# Patient Record
Sex: Female | Born: 1980 | Hispanic: Yes | Marital: Married | Smoking: Never smoker
Health system: Southern US, Community
[De-identification: ages and names within clinical notes are randomized; demographics above are authoritative.]

## PROBLEM LIST (undated history)

## (undated) DIAGNOSIS — R51 Headache: Secondary | ICD-10-CM

## (undated) DIAGNOSIS — D649 Anemia, unspecified: Secondary | ICD-10-CM

## (undated) DIAGNOSIS — Z789 Other specified health status: Secondary | ICD-10-CM

## (undated) DIAGNOSIS — R7303 Prediabetes: Secondary | ICD-10-CM

## (undated) DIAGNOSIS — K219 Gastro-esophageal reflux disease without esophagitis: Secondary | ICD-10-CM

## (undated) DIAGNOSIS — R519 Headache, unspecified: Secondary | ICD-10-CM

## (undated) HISTORY — PX: NO PAST SURGERIES: SHX2092

## (undated) HISTORY — DX: Prediabetes: R73.03

## (undated) HISTORY — DX: Other specified health status: Z78.9

---
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
## 2004-11-15 ENCOUNTER — Inpatient Hospital Stay (HOSPITAL_COMMUNITY): Admission: AD | Admit: 2004-11-15 | Discharge: 2004-11-15 | Payer: Self-pay | Admitting: Obstetrics

## 2004-11-21 ENCOUNTER — Ambulatory Visit (HOSPITAL_COMMUNITY): Admission: RE | Admit: 2004-11-21 | Discharge: 2004-11-21 | Payer: Self-pay | Admitting: Obstetrics & Gynecology

## 2004-12-04 ENCOUNTER — Inpatient Hospital Stay (HOSPITAL_COMMUNITY): Admission: AD | Admit: 2004-12-04 | Discharge: 2004-12-06 | Payer: Self-pay | Admitting: *Deleted

## 2004-12-04 ENCOUNTER — Encounter (INDEPENDENT_AMBULATORY_CARE_PROVIDER_SITE_OTHER): Payer: Self-pay | Admitting: *Deleted

## 2005-04-25 ENCOUNTER — Emergency Department (HOSPITAL_COMMUNITY): Admission: EM | Admit: 2005-04-25 | Discharge: 2005-04-25 | Payer: Self-pay | Admitting: Emergency Medicine

## 2005-04-27 ENCOUNTER — Emergency Department (HOSPITAL_COMMUNITY): Admission: EM | Admit: 2005-04-27 | Discharge: 2005-04-27 | Payer: Self-pay | Admitting: Emergency Medicine

## 2005-05-03 ENCOUNTER — Emergency Department (HOSPITAL_COMMUNITY): Admission: EM | Admit: 2005-05-03 | Discharge: 2005-05-03 | Payer: Self-pay | Admitting: Family Medicine

## 2005-06-22 ENCOUNTER — Emergency Department (HOSPITAL_COMMUNITY): Admission: EM | Admit: 2005-06-22 | Discharge: 2005-06-22 | Payer: Self-pay | Admitting: Family Medicine

## 2005-06-28 ENCOUNTER — Emergency Department (HOSPITAL_COMMUNITY): Admission: EM | Admit: 2005-06-28 | Discharge: 2005-06-28 | Payer: Self-pay | Admitting: Family Medicine

## 2007-05-23 ENCOUNTER — Emergency Department (HOSPITAL_COMMUNITY): Admission: EM | Admit: 2007-05-23 | Discharge: 2007-05-23 | Payer: Self-pay | Admitting: Emergency Medicine

## 2009-10-05 ENCOUNTER — Emergency Department (HOSPITAL_COMMUNITY): Admission: EM | Admit: 2009-10-05 | Discharge: 2009-10-05 | Payer: Self-pay | Admitting: Emergency Medicine

## 2010-06-17 LAB — POCT RAPID STREP A (OFFICE): Streptococcus, Group A Screen (Direct): NEGATIVE

## 2013-10-05 ENCOUNTER — Other Ambulatory Visit: Payer: Self-pay | Admitting: Nurse Practitioner

## 2013-10-05 DIAGNOSIS — N63 Unspecified lump in unspecified breast: Secondary | ICD-10-CM

## 2013-10-12 ENCOUNTER — Ambulatory Visit
Admission: RE | Admit: 2013-10-12 | Discharge: 2013-10-12 | Disposition: A | Discharge status: Home / Self Care | Payer: No Typology Code available for payment source | Source: Ambulatory Visit | Attending: Nurse Practitioner | Admitting: Nurse Practitioner

## 2013-10-12 DIAGNOSIS — N63 Unspecified lump in unspecified breast: Secondary | ICD-10-CM

## 2015-02-07 ENCOUNTER — Other Ambulatory Visit (HOSPITAL_COMMUNITY): Payer: Self-pay | Admitting: *Deleted

## 2015-02-07 DIAGNOSIS — N6320 Unspecified lump in the left breast, unspecified quadrant: Secondary | ICD-10-CM

## 2015-02-08 ENCOUNTER — Encounter (HOSPITAL_COMMUNITY): Payer: Self-pay | Admitting: *Deleted

## 2015-02-10 ENCOUNTER — Ambulatory Visit (HOSPITAL_COMMUNITY)
Admission: RE | Admit: 2015-02-10 | Discharge: 2015-02-10 | Disposition: A | Discharge status: Home / Self Care | Payer: Self-pay | Source: Ambulatory Visit | Attending: Obstetrics and Gynecology | Admitting: Obstetrics and Gynecology

## 2015-02-10 ENCOUNTER — Encounter: Payer: Self-pay | Admitting: *Deleted

## 2015-02-10 ENCOUNTER — Ambulatory Visit: Payer: Self-pay

## 2015-02-10 ENCOUNTER — Encounter (HOSPITAL_COMMUNITY): Payer: Self-pay

## 2015-02-10 ENCOUNTER — Ambulatory Visit
Admission: RE | Admit: 2015-02-10 | Discharge: 2015-02-10 | Disposition: A | Discharge status: Home / Self Care | Payer: No Typology Code available for payment source | Source: Ambulatory Visit | Attending: Obstetrics and Gynecology | Admitting: Obstetrics and Gynecology

## 2015-02-10 ENCOUNTER — Ambulatory Visit (INDEPENDENT_AMBULATORY_CARE_PROVIDER_SITE_OTHER): Payer: Medicaid Other | Admitting: *Deleted

## 2015-02-10 ENCOUNTER — Encounter: Payer: Self-pay | Admitting: Obstetrics & Gynecology

## 2015-02-10 VITALS — BP 118/80 | Temp 98.2°F | Ht 62.0 in | Wt 128.0 lb

## 2015-02-10 DIAGNOSIS — Z1239 Encounter for other screening for malignant neoplasm of breast: Secondary | ICD-10-CM

## 2015-02-10 DIAGNOSIS — N6320 Unspecified lump in the left breast, unspecified quadrant: Secondary | ICD-10-CM

## 2015-02-10 DIAGNOSIS — N6321 Unspecified lump in the left breast, upper outer quadrant: Secondary | ICD-10-CM

## 2015-02-10 DIAGNOSIS — N926 Irregular menstruation, unspecified: Secondary | ICD-10-CM

## 2015-02-10 DIAGNOSIS — Z3201 Encounter for pregnancy test, result positive: Secondary | ICD-10-CM

## 2015-02-10 LAB — POCT PREGNANCY, URINE: PREG TEST UR: POSITIVE — AB

## 2015-02-10 NOTE — Progress Notes (Signed)
Complaints of left breat lump x more than a year that has increased in size per patient. Patient had a diagnostic mammogram and left breast ultrasound [facility] that 6 month follow-up was recommended and patient did not follow up.  Pap Smear: Pap smear not completed today. Last Pap [facility] and normal. Per patient has no history of an abnormal Pap smear. Pap smear result is in EPIC.  Physical exam: Breasts Breasts symmetrical. No skin abnormalities bilateral breasts. No nipple retraction bilateral breasts. No nipple discharge bilateral breasts. No lymphadenopathy. No lumps palpated right breast. Palpated a lump within the left breast at 2 o'clock 4 cm from the nipple. No complaints of pain or tenderness on exam. [facility]. Appointment scheduled for Friday, February 10, 2015 at 1400.   Pelvic/Bimanual No Pap smear completed today since last Pap smear was 01/06/2015. Pap smear not indicated per [facility] guidelines.   Used interpreter Nira ConnJulia Sowell.

## 2015-02-10 NOTE — Progress Notes (Signed)
Pt sent from [facility] to have urine pregnancy test. Result - positive.  Pregnancy verification letter given.

## 2015-02-10 NOTE — Patient Instructions (Addendum)
Educational materials on self breast awareness given. Explained to [name] that she did not need a Pap smear today due to last Pap smear was 01/06/2015. Let her know [facility] will cover Pap smears every 3 years unless has a history of abnormal Pap smears. [facility] breast [facility]. Appointment scheduled for Friday, February 10, 2015 at 1400. Patient aware of appointment and will be there. [name] verbalized understanding.  [name], Annette Maserhristine Poll, RN 11:53 AM

## 2015-03-29 ENCOUNTER — Ambulatory Visit (INDEPENDENT_AMBULATORY_CARE_PROVIDER_SITE_OTHER): Payer: Medicaid Other | Admitting: Certified Nurse Midwife

## 2015-03-29 ENCOUNTER — Encounter: Payer: Self-pay | Admitting: Certified Nurse Midwife

## 2015-03-29 VITALS — BP 123/66 | HR 90 | Temp 98.2°F | Wt 133.0 lb

## 2015-03-29 DIAGNOSIS — Z3481 Encounter for supervision of other normal pregnancy, first trimester: Secondary | ICD-10-CM | POA: Diagnosis not present

## 2015-03-29 DIAGNOSIS — O219 Vomiting of pregnancy, unspecified: Secondary | ICD-10-CM | POA: Diagnosis not present

## 2015-03-29 DIAGNOSIS — Z3491 Encounter for supervision of normal pregnancy, unspecified, first trimester: Secondary | ICD-10-CM | POA: Insufficient documentation

## 2015-03-29 LAB — POCT URINALYSIS DIPSTICK
Bilirubin, UA: NEGATIVE
Blood, UA: NEGATIVE
Glucose, UA: NEGATIVE
Ketones, UA: NEGATIVE
LEUKOCYTES UA: NEGATIVE
Nitrite, UA: NEGATIVE
PROTEIN UA: NEGATIVE
Spec Grav, UA: 1.015
UROBILINOGEN UA: NEGATIVE
pH, UA: 6

## 2015-03-29 LAB — TSH: TSH: 0.552 u[IU]/mL (ref 0.350–4.500)

## 2015-03-29 MED ORDER — DOXYLAMINE-PYRIDOXINE 10-10 MG PO TBEC: DELAYED_RELEASE_TABLET | ORAL | Status: DC

## 2015-03-29 MED ORDER — METOCLOPRAMIDE HCL 10 MG PO TABS: 10.0000 mg | ORAL_TABLET | Freq: Three times a day (TID) | ORAL | Status: DC

## 2015-03-29 MED ORDER — PROMETHAZINE HCL 25 MG PO TABS: 25.0000 mg | ORAL_TABLET | Freq: Four times a day (QID) | ORAL | Status: DC | PRN

## 2015-03-29 NOTE — Progress Notes (Signed)
Subjective:    Annette Whitehead is being seen today for her first obstetrical visit.  This is a planned pregnancy. She is at [redacted]w[redacted]d gestation. Her obstetrical history is significant for none. Relationship with FOB: spouse, living together. Patient does intend to breast feed. Pregnancy history fully reviewed.  Had IUD removed and got pregnant.    The information documented in the HPI was reviewed and verified.  Menstrual History: OB History    Gravida Para Term Preterm AB TAB SAB Ectopic Multiple Living   0 0 0 0 0 0 2      Menarche age: 29-58 years of age.  Patient's last menstrual period was 01/04/2015 (exact date).    Past Medical History  Diagnosis Date  . Medical history non-contributory     Past Surgical History  Procedure Laterality Date  . No past surgeries       (Not in a hospital admission) No Known Allergies  Social History  Substance Use Topics  . Smoking status: Never Smoker   . Smokeless tobacco: Never Used  . Alcohol Use: No    Family History  Problem Relation Age of Onset  . Diabetes Mother   . Hypertension Mother   . Hyperlipidemia Mother   . Osteoporosis Mother   . Diabetes Father      Review of Systems Constitutional: negative for weight loss Gastrointestinal: + for nausea & vomiting Genitourinary:negative for genital lesions and vaginal discharge and dysuria Musculoskeletal:negative for back pain Behavioral/Psych: negative for abusive relationship, depression, illegal drug usage and tobacco use    Objective:    BP 123/66 mmHg  Pulse 90  Temp(Src) 98.2 F (36.8 C)  Wt 133 lb (60.328 kg)  LMP 01/04/2015 (Exact Date) General Appearance:    Alert, cooperative, no distress, appears stated age  Head:    Normocephalic, without obvious abnormality, atraumatic  Eyes:    PERRL, conjunctiva/corneas clear, EOM's intact, fundi    benign, both eyes  Ears:    Normal TM's and external ear canals, both ears  Nose:   Nares normal, septum midline,  mucosa normal, no drainage    or sinus tenderness  Throat:   Lips, mucosa, and tongue normal; teeth and gums normal  Neck:   Supple, symmetrical, trachea midline, no adenopathy;    thyroid:  no enlargement/tenderness/nodules; no carotid   bruit or JVD  Back:     Symmetric, no curvature, ROM normal, no CVA tenderness  Lungs:     Clear to auscultation bilaterally, respirations unlabored  Chest Wall:    No tenderness or deformity   Heart:    Regular rate and rhythm, S1 and S2 normal, no murmur, rub   or gallop  Breast Exam:    No tenderness, masses, or nipple abnormality  Abdomen:     Soft, non-tender, bowel sounds active all four quadrants,    no masses, no organomegaly  Genitalia:    Normal female without lesion, discharge or tenderness  Extremities:   Extremities normal, atraumatic, no cyanosis or edema  Pulses:   2+ and symmetric all extremities  Skin:   Skin color, texture, turgor normal, no rashes or lesions  Lymph nodes:   Cervical, supraclavicular, and axillary nodes normal  Neurologic:   CNII-XII intact, normal strength, sensation and reflexes    throughout     Cervix:  Long, thick, closed and posterior  FHR:  160's by doppler.  Fundal height below U.   Lab Review Urine pregnancy test Labs reviewed yes Radiologic studies reviewed no Assessment:    Pregnancy at 4335w0d weeks   N&V in early pregnancy  Plan:      Prenatal vitamins.  Counseling provided regarding continued use of seat belts, cessation of alcohol consumption, smoking or use of illicit drugs; infection precautions i.e., influenza/TDAP immunizations, toxoplasmosis,CMV, parvovirus, listeria and varicella; workplace safety, exercise during pregnancy; routine dental care, safe medications, sexual activity, hot tubs, saunas, pools, travel, caffeine use, fish and methlymercury, potential toxins, hair treatments, varicose veins Weight gain recommendations per IOM guidelines reviewed:  underweight/BMI< 18.5--> gain 28 - 40 lbs; normal weight/BMI 18.5 - 24.9--> gain 25 - 35 lbs; overweight/BMI 25 - 29.9--> gain 15 - 25 lbs; obese/BMI >30->gain  11 - 20 lbs Problem list reviewed and updated. FIRST/CF mutation testing/NIPT/QUAD SCREEN/fragile X/Ashkenazi Jewish population testing/Spinal muscular atrophy discussed: requested. Role of ultrasound in pregnancy discussed; fetal survey: requested. Amniocentesis discussed: not indicated. VBAC calculator score: VBAC consent form provided Meds ordered this encounter  Medications  . Prenatal Vit-Fe Fumarate-FA (MULTIVITAMIN-PRENATAL) 27-0.8 MG TABS tablet    Sig: Take 1 tablet by mouth daily at 12 noon.   Orders Placed This Encounter  Procedures  . Culture, OB Urine  . SureSwab, Vaginosis/Vaginitis Plus  . Obstetric panel  . HIV antibody  . Hemoglobinopathy evaluation  . Varicella zoster antibody, IgG  . VITAMIN D 25 Hydroxy (Vit-D Deficiency, Fractures)  . TSH  . POCT urinalysis dipstick    Follow up in 4 weeks. 50% of 30 min visit spent on counseling and coordination of care.

## 2015-03-30 LAB — OBSTETRIC PANEL
Antibody Screen: NEGATIVE
Basophils Absolute: 0 10*3/uL (ref 0.0–0.1)
Basophils Relative: 0 % (ref 0–1)
Eosinophils Absolute: 0 10*3/uL (ref 0.0–0.7)
Eosinophils Relative: 0 % (ref 0–5)
HCT: 40.3 % (ref 36.0–46.0)
HEP B S AG: NEGATIVE
Hemoglobin: 13.7 g/dL (ref 12.0–15.0)
Lymphocytes Relative: 14 % (ref 12–46)
Lymphs Abs: 1 10*3/uL (ref 0.7–4.0)
MCH: 28.8 pg (ref 26.0–34.0)
MCHC: 34 g/dL (ref 30.0–36.0)
MCV: 84.7 fL (ref 78.0–100.0)
MPV: 9.9 fL (ref 8.6–12.4)
Monocytes Absolute: 0.3 10*3/uL (ref 0.1–1.0)
Monocytes Relative: 5 % (ref 3–12)
NEUTROS ABS: 5.6 10*3/uL (ref 1.7–7.7)
Neutrophils Relative %: 81 % — ABNORMAL HIGH (ref 43–77)
Platelets: 183 10*3/uL (ref 150–400)
RBC: 4.76 MIL/uL (ref 3.87–5.11)
RDW: 14.1 % (ref 11.5–15.5)
RH TYPE: POSITIVE
Rubella: 1.51 Index — ABNORMAL HIGH (ref <0.90)
WBC: 6.9 10*3/uL (ref 4.0–10.5)

## 2015-03-30 LAB — VARICELLA ZOSTER ANTIBODY, IGG: Varicella IgG: 1287 Index — ABNORMAL HIGH (ref <135.00)

## 2015-03-30 LAB — VITAMIN D 25 HYDROXY (VIT D DEFICIENCY, FRACTURES): VIT D 25 HYDROXY: 22 ng/mL — AB (ref 30–100)

## 2015-03-30 LAB — HIV ANTIBODY (ROUTINE TESTING W REFLEX): HIV 1&2 Ab, 4th Generation: NONREACTIVE

## 2015-03-31 LAB — CULTURE, OB URINE
COLONY COUNT: NO GROWTH
Organism ID, Bacteria: NO GROWTH

## 2015-03-31 LAB — HEMOGLOBINOPATHY EVALUATION
HEMOGLOBIN OTHER: 0 %
HGB A2 QUANT: 1.7 % — AB (ref 2.2–3.2)
HGB A: 98.3 % — AB (ref 96.8–97.8)
HGB F QUANT: 0 % (ref 0.0–2.0)
HGB S QUANTITAION: 0 %

## 2015-04-01 LAB — SURESWAB, VAGINOSIS/VAGINITIS PLUS
ATOPOBIUM VAGINAE: NOT DETECTED Log (cells/mL)
BV CATEGORY: UNDETERMINED — AB
C. PARAPSILOSIS, DNA: NOT DETECTED
C. TRACHOMATIS RNA, TMA: NOT DETECTED
C. TROPICALIS, DNA: NOT DETECTED
C. albicans, DNA: NOT DETECTED
C. glabrata, DNA: NOT DETECTED
GARDNERELLA VAGINALIS: 6.6 Log (cells/mL)
LACTOBACILLUS SPECIES: DETECTED Log (cells/mL)
MEGASPHAERA SPECIES: NOT DETECTED Log (cells/mL)
N. gonorrhoeae RNA, TMA: NOT DETECTED
T. vaginalis RNA, QL TMA: NOT DETECTED

## 2015-04-02 NOTE — L&D Delivery Note (Signed)
Delivery Note At 7:56 AM a viable female was delivered via Vaginal, Spontaneous Delivery (Presentation: R ; OA ).  APGAR: 8,9 ; weight  pending.   Placenta status: Intact, Spontaneous.  Cord:  with the following complications: .  Cord pH: n/a  Anesthesia: Epidural  Episiotomy:  none Lacerations:  none Suture Repair: none Est. Blood Loss (mL): 100cc  Mom to postpartum.  Baby to Couplet care / Skin to Skin.  Annette Whitehead 10/02/2015, 8:09 AM

## 2015-04-05 ENCOUNTER — Other Ambulatory Visit: Payer: Self-pay | Admitting: Certified Nurse Midwife

## 2015-04-05 DIAGNOSIS — B9689 Other specified bacterial agents as the cause of diseases classified elsewhere: Secondary | ICD-10-CM

## 2015-04-05 DIAGNOSIS — N760 Acute vaginitis: Principal | ICD-10-CM

## 2015-04-05 MED ORDER — METRONIDAZOLE 0.75 % VA GEL: 1.0000 | Freq: Two times a day (BID) | VAGINAL | Status: DC

## 2015-04-11 ENCOUNTER — Ambulatory Visit (INDEPENDENT_AMBULATORY_CARE_PROVIDER_SITE_OTHER): Payer: Medicaid Other | Admitting: Certified Nurse Midwife

## 2015-04-11 VITALS — BP 117/64 | HR 77 | Temp 98.1°F | Wt 131.0 lb

## 2015-04-11 DIAGNOSIS — O219 Vomiting of pregnancy, unspecified: Secondary | ICD-10-CM

## 2015-04-11 DIAGNOSIS — Z3482 Encounter for supervision of other normal pregnancy, second trimester: Secondary | ICD-10-CM

## 2015-04-11 DIAGNOSIS — O4692 Antepartum hemorrhage, unspecified, second trimester: Secondary | ICD-10-CM

## 2015-04-11 LAB — POCT URINALYSIS DIPSTICK
BILIRUBIN UA: NEGATIVE
Blood, UA: NEGATIVE
GLUCOSE UA: NEGATIVE
Ketones, UA: NEGATIVE
LEUKOCYTES UA: NEGATIVE
NITRITE UA: NEGATIVE
Protein, UA: NEGATIVE
Spec Grav, UA: 1.015
Urobilinogen, UA: NEGATIVE
pH, UA: 6

## 2015-04-11 MED ORDER — VITAFOL GUMMIES 3.33-0.333-34.8 MG PO CHEW: 3.0000 | CHEWABLE_TABLET | Freq: Every day | ORAL | Status: DC

## 2015-04-11 MED ORDER — ONDANSETRON HCL 4 MG PO TABS: 4.0000 mg | ORAL_TABLET | Freq: Every day | ORAL | Status: DC | PRN

## 2015-04-11 NOTE — Progress Notes (Signed)
  Subjective:    Annette Whitehead is a 35 y.o. female being seen today for her obstetrical visit. She is at 5030w6d gestation. Patient reports: backache, bleeding, no contractions, no cramping and no leaking.  States that it was when she was wiping after using the bathroom on Saturday.  States that it is brown afterward, nothing today or since Sunday.  Denies any recent sexual intercourse.    Problem List Items Addressed This Visit    None    Visit Diagnoses    Supervision of other normal pregnancy, antepartum, second trimester    -  Primary    Relevant Medications    Prenatal Vit-Fe Phos-FA-Omega (VITAFOL GUMMIES) 3.33-0.333-34.8 MG CHEW    Other Relevant Orders    POCT urinalysis dipstick (Completed)    Vaginal bleeding in pregnancy, second trimester        Relevant Orders    US OB Comp Less 14 Wks    Nausea and vomiting in pregnancy prior to [redacted] weeks gestation        Relevant Medications    ondansetron (ZOFRAN) 4 MG tablet      Patient Active Problem List   Diagnosis Date Noted  . Supervision of normal pregnancy in first trimester 03/29/2015    Objective:     BP 117/64 mmHg  Pulse 77  Temp(Src) 98.1 F (36.7 C)  Wt 131 lb (59.421 kg)  LMP 01/04/2015 (Exact Date) Uterine Size: Below umbilicus   FHR: 160 by doppler.  FHR and fetal activity seen with bedside US.   Assessment:    Pregnancy @ 4030w6d  weeks Vaginal bleeding in early pregnancy.      Plan:   Reassurance given.   US ordered  Problem list reviewed and updated. Labs reviewed.  Follow up in 4 weeks. FIRST/CF mutation testing/NIPT/QUAD SCREEN/fragile X/Ashkenazi Jewish population testing/Spinal muscular atrophy discussed: requested. Role of ultrasound in pregnancy discussed; fetal survey: requested. Amniocentesis discussed: not indicated. 50% of 25 minute visit spent on counseling and coordination of care.

## 2015-04-11 NOTE — Progress Notes (Signed)
Patient states she had some bleeding over the weekend. Patient denies heavy bleeding or passing of any tissue. Patient states it was only when she was wiping. Patient states she is having pressure in her lower abdomen. Patient states the bleeding has stopped

## 2015-04-20 ENCOUNTER — Other Ambulatory Visit: Payer: Self-pay | Admitting: Certified Nurse Midwife

## 2015-04-20 ENCOUNTER — Ambulatory Visit: Payer: Medicaid Other | Admitting: Certified Nurse Midwife

## 2015-04-20 ENCOUNTER — Ambulatory Visit (INDEPENDENT_AMBULATORY_CARE_PROVIDER_SITE_OTHER): Payer: Medicaid Other

## 2015-04-20 ENCOUNTER — Ambulatory Visit (INDEPENDENT_AMBULATORY_CARE_PROVIDER_SITE_OTHER): Payer: Medicaid Other | Admitting: Certified Nurse Midwife

## 2015-04-20 VITALS — BP 111/65 | HR 90 | Temp 98.8°F | Wt 135.0 lb

## 2015-04-20 DIAGNOSIS — O4692 Antepartum hemorrhage, unspecified, second trimester: Secondary | ICD-10-CM

## 2015-04-20 DIAGNOSIS — Z1389 Encounter for screening for other disorder: Secondary | ICD-10-CM

## 2015-04-20 DIAGNOSIS — Z3482 Encounter for supervision of other normal pregnancy, second trimester: Secondary | ICD-10-CM

## 2015-04-20 DIAGNOSIS — Z331 Pregnant state, incidental: Secondary | ICD-10-CM

## 2015-04-20 NOTE — Progress Notes (Signed)
  Subjective:    Annette Whitehead is a 35 y.o. female being seen today for her obstetrical visit. She is at [redacted]w[redacted]d gestation. Patient reports: no complaints.  Problem List Items Addressed This Visit    None    Visit Diagnoses    Supervision of other normal pregnancy, antepartum, second trimester    -  Primary    Relevant Orders    POCT urinalysis dipstick    AFP, Quad Screen    [place] MFM OB COMP + 14 WK      Patient Active Problem List   Diagnosis Date Noted  . Supervision of normal pregnancy in first trimester 03/29/2015    Objective:     BP 111/65 mmHg  Pulse 90  Temp(Src) 98.8 F (37.1 C)  Wt 135 lb (61.236 kg)  LMP 01/04/2015 (Exact Date) Uterine Size: Below umbilicus   FHR: 150's  Assessment:    Pregnancy @ [redacted]w[redacted]d  weeks Doing well    Plan:    Problem list reviewed and updated. Labs reviewed.  Follow up in 4 weeks. FIRST/CF mutation testing/NIPT/QUAD SCREEN/fragile X/Ashkenazi Jewish population testing/Spinal muscular atrophy discussed: ordered. Role of ultrasound in pregnancy discussed; fetal survey: ordered. Amniocentesis discussed: not indicated. 50% of 15 minute visit spent on counseling and coordination of care.

## 2015-04-21 LAB — AFP, QUAD SCREEN
AFP: 66.3 ng/mL
Age Alone: 1:375 {titer}
Curr Gest Age: 15.1 wks.days
Down Syndrome Scr Risk Est: 1:121 {titer}
HCG TOTAL: 91.07 [IU]/mL
INH: 340.1 pg/mL
Interpretation-AFP: POSITIVE — AB
MOM FOR HCG: 2.11
MOM FOR INH: 2.17
MoM for AFP: 2.3
OPEN SPINA BIFIDA: NEGATIVE
Osb Risk: 1:376 {titer}
Tri 18 Scr Risk Est: NEGATIVE
Trisomy 18 (Edward) Syndrome Interp.: 1:2710 {titer}
uE3 Mom: 0.46
uE3 Value: 0.4 ng/mL

## 2015-04-22 ENCOUNTER — Other Ambulatory Visit: Payer: Self-pay | Admitting: Certified Nurse Midwife

## 2015-04-22 DIAGNOSIS — O4402 Placenta previa specified as without hemorrhage, second trimester: Secondary | ICD-10-CM

## 2015-04-25 ENCOUNTER — Other Ambulatory Visit: Payer: Self-pay | Admitting: Certified Nurse Midwife

## 2015-04-25 DIAGNOSIS — R772 Abnormality of alphafetoprotein: Secondary | ICD-10-CM

## 2015-04-26 ENCOUNTER — Ambulatory Visit (INDEPENDENT_AMBULATORY_CARE_PROVIDER_SITE_OTHER): Payer: Medicaid Other | Admitting: Certified Nurse Midwife

## 2015-04-26 VITALS — BP 115/74 | HR 84 | Temp 98.2°F | Wt 133.0 lb

## 2015-04-26 DIAGNOSIS — Z3492 Encounter for supervision of normal pregnancy, unspecified, second trimester: Secondary | ICD-10-CM

## 2015-04-26 LAB — POCT URINALYSIS DIPSTICK
Bilirubin, UA: NEGATIVE
Blood, UA: NEGATIVE
Glucose, UA: NEGATIVE
Ketones, UA: NEGATIVE
Nitrite, UA: NEGATIVE
PROTEIN UA: NEGATIVE
Spec Grav, UA: <=1.005
UROBILINOGEN UA: NEGATIVE
pH, UA: 7

## 2015-04-26 NOTE — Progress Notes (Signed)
Patient is having some pressure- some more yesterday than usual

## 2015-04-26 NOTE — Progress Notes (Signed)
  Subjective:    Annette Whitehead is a 35 y.o. female being seen today for her obstetrical visit. She is at [redacted]w[redacted]d gestation. Patient reports: no bleeding, no contractions, no cramping and no leaking.  Discussed elevated AFP.  Patient is scheduled with MFM in mid February.    Problem List Items Addressed This Visit    None    Visit Diagnoses    Prenatal care, second trimester    -  Primary    Relevant Orders    POCT urinalysis dipstick (Completed)      Patient Active Problem List   Diagnosis Date Noted  . Placenta previa antepartum in second trimester 04/22/2015  . Supervision of normal pregnancy in first trimester 03/29/2015    Objective:     BP 115/74 mmHg  Pulse 84  Temp(Src) 98.2 F (36.8 C)  Wt 133 lb (60.328 kg)  LMP 01/04/2015 (Exact Date) Uterine Size: Below umbilicus   FHR about 160's.    Assessment:    Pregnancy @ [redacted]w[redacted]d  weeks  Placenta previa, ? Cause of elevated AFP     Elevated AFP  Plan:    Problem list reviewed and updated. Labs reviewed.  Follow up in 4 weeks. FIRST/CF mutation testing/NIPT/QUAD SCREEN/fragile X/Ashkenazi Jewish population testing/Spinal muscular atrophy discussed: results reviewed. Role of ultrasound in pregnancy discussed; fetal survey: ordered. Amniocentesis discussed: deferred to MFM. 50% of 15 minute visit spent on counseling and coordination of care.

## 2015-05-15 ENCOUNTER — Encounter (HOSPITAL_COMMUNITY): Payer: Self-pay

## 2015-05-15 ENCOUNTER — Other Ambulatory Visit: Payer: Self-pay | Admitting: Certified Nurse Midwife

## 2015-05-15 ENCOUNTER — Ambulatory Visit (HOSPITAL_COMMUNITY)
Admission: RE | Admit: 2015-05-15 | Discharge: 2015-05-15 | Disposition: A | Discharge status: Home / Self Care | Payer: Medicaid Other | Source: Ambulatory Visit | Attending: Certified Nurse Midwife | Admitting: Certified Nurse Midwife

## 2015-05-15 VITALS — BP 116/65 | HR 70 | Wt 137.2 lb

## 2015-05-15 DIAGNOSIS — O280 Abnormal hematological finding on antenatal screening of mother: Secondary | ICD-10-CM

## 2015-05-15 DIAGNOSIS — Z1389 Encounter for screening for other disorder: Secondary | ICD-10-CM

## 2015-05-15 DIAGNOSIS — O4442 Low lying placenta NOS or without hemorrhage, second trimester: Secondary | ICD-10-CM | POA: Insufficient documentation

## 2015-05-15 DIAGNOSIS — Z3A18 18 weeks gestation of pregnancy: Secondary | ICD-10-CM

## 2015-05-15 DIAGNOSIS — Z36 Encounter for antenatal screening of mother: Secondary | ICD-10-CM | POA: Diagnosis not present

## 2015-05-15 DIAGNOSIS — Z3482 Encounter for supervision of other normal pregnancy, second trimester: Secondary | ICD-10-CM

## 2015-05-15 DIAGNOSIS — O289 Unspecified abnormal findings on antenatal screening of mother: Secondary | ICD-10-CM

## 2015-05-15 NOTE — Progress Notes (Signed)
Genetic Counseling  High-Risk Gestation Note  Appointment Date:  05/15/2015 Referred By: Roe Coombs, CNM Date of Birth:  1981-01-27 Partner:  Rozell Searing   Pregnancy History: [id number] Estimated Date of Delivery: 10/11/15 Estimated Gestational Age: [redacted]w[redacted]d Attending: Particia Nearing, MD    Ms. Libbi Largo and her husband, Mr. Rozell Searing, were seen for genetic counseling because of an increased risk for fetal Down syndrome based on Quad screen through [facility] laboratory.  In Summary:   1 in 121 Down syndrome risk from Quad screen  Detailed ultrasound performed today; Fetal anatomy within normal limits  Patient declined NIPS and amniocentesis  Follow-up ultrasound scheduled in 8 weeks given elevated DIA on Quad screen and given low lying placenta  Family history significant for sister to patient with birth defect; see discussion below.   They were counseled regarding the Quad screen result and the associated 1 in 121 risk for fetal Down syndrome.  We reviewed chromosomes, nondisjunction, and the common features and variable prognosis of Down syndrome.  In addition, we reviewed the screen negative risks for trisomy 18 and ONTDs.  We also discussed other explanations for a screen positive result including: a gestational dating error, differences in maternal metabolism, and normal variation. They understand that this screening is not diagnostic for Down syndrome but provides a risk assessment.  We specifically discussed that the level of one of the proteins analyzed on the screen, DIA, was very high (2.17 MoM).  This has been associated with an increased risk for growth restriction or poor pregnancy outcome later in pregnancy; therefore, we would recommend a follow up ultrasound for fetal growth in the third trimester.  We reviewed available screening options including noninvasive prenatal screening (NIPS)/prenatal cell free DNA (cfDNA) testing, and detailed ultrasound.   They were counseled that screening tests are used to modify a patient's a priori risk for aneuploidy, typically based on age. This estimate provides a pregnancy specific risk assessment. We reviewed the benefits and limitations of each option. Specifically, we discussed the conditions for which each test screens, the detection rates, and false positive rates of each. They were also counseled regarding diagnostic testing via amniocentesis. We reviewed the approximate 1 in 300-500 risk for complications for amniocentesis, including spontaneous pregnancy loss.   A complete ultrasound was performed today. The ultrasound report will be sent under separate cover. There were no visualized fetal anomalies or markers suggestive of aneuploidy. We reviewed the benefits and limitations of ultrasound as a screening tool for fetal aneuploidy.   After consideration of all the options, they declined NIPS and amniocentesis, stating they were comfortable with risk assessment from Quad screen and ultrasound.  They understand that screening tests cannot rule out all birth defects or genetic syndromes. The patient was advised of this limitation and states she still does not want additional testing at this time.   Ms. Vollie Aaron was provided with written information regarding cystic fibrosis (CF) including the carrier frequency and incidence in the Caucasian population, the availability of carrier testing and prenatal diagnosis if indicated.  In addition, we discussed that CF is routinely screened for as part of the [place] newborn screening panel.  She declined CF testing today.   Both family histories were reviewed and found to be contributory for a sister to the patient who died shortly after birth and had a birth defect. The patient reported that this sibling, the third about of seven children for her parents together, in addition to her mother's two previous  additional children, was described to have been missing her mouth.  The patient had very limited information regarding the medical features and reported that her sister was born in [place]. We discussed that this description could fit with an orofacial cleft. We briefly discussed that clefting is often isolated and multifactorial in origin but can be part of an underlying genetic syndrome or due to teratogenic exposures in pregnancy. Given the reported family history and additional unaffected siblings, we discussed that recurrence risk for the current pregnancy is likely relatively low. However, without further information regarding the provided family history, an accurate genetic risk cannot be calculated. Further genetic counseling is warranted if more information is obtained.  The patient also reported that her maternal grandmother had several sons who died in infancy given that they "stopped eating." They were not described to have birth defects, and she had very limited information about these relatives. The patient reported that her grandmother likely had limited access to medical care when her children were born. We discussed that it is difficult to discern the possible underlying cause for this without additional information. However, given the degree of relation and the additional reported family history, recurrence risk would likely be low for the pregnancy. Additional information may alter recurrence risk assessment.   Mrs. [facility] denied exposure to environmental toxins or chemical agents. She reported bleeding in the pregnancy at approximately [redacted] weeks gestation. She denied the use of tobacco or street drugs. She reported sipping her husband's beer on occasion if they are at a restaurant.  Prenatal alcohol exposure can increase the risk for growth delays, small head size, heart defects, eye and facial differences, as well as behavior problems and learning disabilities. The risk of these to occur tends to increase with the amount of alcohol consumed.  However, because there is no identified safe amount of alcohol in pregnancy, it is recommended to completely avoid alcohol in pregnancy. Given the reported amount of exposure, risk for associated effects are likely low in the current pregnancy. She denied significant viral illnesses during the course of her pregnancy. Her medical and surgical histories were noncontributory.   I counseled this couple for approximately 45 minutes regarding the above risks and available options.   Quinn Plowman, MS,  Certified Genetic Counselor 05/15/2015

## 2015-05-18 ENCOUNTER — Ambulatory Visit (INDEPENDENT_AMBULATORY_CARE_PROVIDER_SITE_OTHER): Payer: Medicaid Other | Admitting: Certified Nurse Midwife

## 2015-05-18 ENCOUNTER — Ambulatory Visit (HOSPITAL_COMMUNITY): Payer: No Typology Code available for payment source

## 2015-05-18 VITALS — BP 113/72 | HR 92 | Temp 98.5°F | Wt 136.0 lb

## 2015-05-18 DIAGNOSIS — Z3482 Encounter for supervision of other normal pregnancy, second trimester: Secondary | ICD-10-CM

## 2015-05-18 DIAGNOSIS — J069 Acute upper respiratory infection, unspecified: Secondary | ICD-10-CM

## 2015-05-18 LAB — POCT URINALYSIS DIPSTICK
Bilirubin, UA: NEGATIVE
Glucose, UA: NEGATIVE
Ketones, UA: NEGATIVE
Leukocytes, UA: NEGATIVE
NITRITE UA: NEGATIVE
PROTEIN UA: NEGATIVE
RBC UA: NEGATIVE
SPEC GRAV UA: 1.015
Urobilinogen, UA: NEGATIVE
pH, UA: 7

## 2015-05-18 MED ORDER — PSEUDOEPH-BROMPHEN-DM 30-2-10 MG/5ML PO SYRP: 5.0000 mL | ORAL_SOLUTION | Freq: Four times a day (QID) | ORAL | Status: DC | PRN

## 2015-05-18 MED ORDER — DIPHENHYDRAMINE HCL 25 MG PO TABS: 50.0000 mg | ORAL_TABLET | Freq: Every day | ORAL | Status: DC

## 2015-05-18 MED ORDER — PSEUDOEPHEDRINE HCL 30 MG PO TABS: 30.0000 mg | ORAL_TABLET | ORAL | Status: DC | PRN

## 2015-05-18 MED ORDER — DM-GUAIFENESIN ER 30-600 MG PO TB12: 2.0000 | ORAL_TABLET | Freq: Two times a day (BID) | ORAL | Status: DC

## 2015-05-18 MED ORDER — CITRANATAL HARMONY 30-1-260 MG PO CAPS: 1.0000 | ORAL_CAPSULE | Freq: Every day | ORAL | Status: DC

## 2015-05-19 ENCOUNTER — Other Ambulatory Visit: Payer: Self-pay | Admitting: Certified Nurse Midwife

## 2015-05-19 NOTE — Progress Notes (Signed)
Subjective:    Annette Whitehead is a 35 y.o. female being seen today for her obstetrical visit. She is at [redacted]w[redacted]d gestation. Patient reports: fatigue, headache, no bleeding, no contractions, no cramping, no leaking and URI symptoms for about a week, she thinks they are getting better.  encouraged chicken noodle soup, rest, fluids. Discussed no sexual intercourse or vaginal activity d/t placenta previa. Fetal movement: normal.  Problem List Items Addressed This Visit    None    Visit Diagnoses    Encounter for supervision of other normal pregnancy in second trimester    -  Primary    Relevant Medications    Prenat w/o A-FeCbn-DSS-FA-DHA (CITRANATAL HARMONY) 30-1-260 MG CAPS    Other Relevant Orders    POCT urinalysis dipstick (Completed)    URI (upper respiratory infection)        Relevant Medications    diphenhydrAMINE (BENADRYL) 25 MG tablet    dextromethorphan-guaiFENesin (MUCINEX DM) 30-600 MG 12hr tablet    pseudoephedrine (SUDAFED) 30 MG tablet    brompheniramine-pseudoephedrine-DM 30-2-10 MG/5ML syrup      Patient Active Problem List   Diagnosis Date Noted  . Abnormal maternal serum screening test 05/15/2015  . Placenta previa antepartum in second trimester 04/22/2015  . Supervision of normal pregnancy in first trimester 03/29/2015   Objective:    BP 113/72 mmHg  Pulse 92  Temp(Src) 98.5 F (36.9 C)  Wt 136 lb (61.689 kg)  LMP 01/04/2015 (Exact Date) FHT: 155 BPM  Uterine Size: size equals dates   Lungs: CTA bilaterally  Assessment:    Pregnancy @ [redacted]w[redacted]d    Placenta previa  URI  Plan:    OBGCT: discussed. Signs and symptoms of preterm labor: discussed.  Labs, problem list reviewed and updated 2 hr GTT planned Follow up in 4 weeks.

## 2015-06-15 ENCOUNTER — Ambulatory Visit (INDEPENDENT_AMBULATORY_CARE_PROVIDER_SITE_OTHER): Payer: Medicaid Other | Admitting: Certified Nurse Midwife

## 2015-06-15 VITALS — BP 103/69 | HR 88 | Temp 97.9°F | Wt 142.0 lb

## 2015-06-15 DIAGNOSIS — Z3482 Encounter for supervision of other normal pregnancy, second trimester: Secondary | ICD-10-CM

## 2015-06-15 LAB — POCT URINALYSIS DIPSTICK
Bilirubin, UA: NEGATIVE
Glucose, UA: NEGATIVE
KETONES UA: NEGATIVE
Leukocytes, UA: NEGATIVE
Nitrite, UA: NEGATIVE
Protein, UA: 30
RBC UA: NEGATIVE
Spec Grav, UA: 1.025
UROBILINOGEN UA: NEGATIVE
pH, UA: 6

## 2015-06-15 NOTE — Progress Notes (Signed)
Subjective:    Annette Whitehead is a 35 y.o. female being seen today for her obstetrical visit. She is at 4163w1d gestation. Patient reports: no complaints . Fetal movement: normal. Doing well after URI.   Problem List Items Addressed This Visit    None     Patient Active Problem List   Diagnosis Date Noted  . Abnormal maternal serum screening test 05/15/2015  . Placenta previa antepartum in second trimester 04/22/2015  . Supervision of normal pregnancy in first trimester 03/29/2015   Objective:    BP 103/69 mmHg  Pulse 88  Temp(Src) 97.9 F (36.6 C)  Wt 142 lb (64.411 kg)  LMP 01/04/2015 (Exact Date) FHT: 153 BPM  Uterine Size: size equals dates     Assessment:    Pregnancy @ 8863w1d    Doing well.   Placenta previa  Plan:    OBGCT: discussed and ordered for next visit. Signs and symptoms of preterm labor: discussed.  Labs, problem list reviewed and updated 2 hr GTT planned Follow up in 4 weeks.

## 2015-06-26 ENCOUNTER — Other Ambulatory Visit: Payer: Self-pay | Admitting: Certified Nurse Midwife

## 2015-07-05 ENCOUNTER — Other Ambulatory Visit: Payer: Self-pay | Admitting: Obstetrics and Gynecology

## 2015-07-05 DIAGNOSIS — N63 Unspecified lump in unspecified breast: Secondary | ICD-10-CM

## 2015-07-10 ENCOUNTER — Other Ambulatory Visit (HOSPITAL_COMMUNITY): Payer: Self-pay | Admitting: Maternal and Fetal Medicine

## 2015-07-10 ENCOUNTER — Ambulatory Visit (HOSPITAL_COMMUNITY)
Admission: RE | Admit: 2015-07-10 | Discharge: 2015-07-10 | Disposition: A | Discharge status: Home / Self Care | Payer: Medicaid Other | Source: Ambulatory Visit | Attending: Certified Nurse Midwife | Admitting: Certified Nurse Midwife

## 2015-07-10 DIAGNOSIS — O280 Abnormal hematological finding on antenatal screening of mother: Secondary | ICD-10-CM

## 2015-07-10 DIAGNOSIS — O4442 Low lying placenta NOS or without hemorrhage, second trimester: Secondary | ICD-10-CM | POA: Diagnosis not present

## 2015-07-10 DIAGNOSIS — Z3A26 26 weeks gestation of pregnancy: Secondary | ICD-10-CM | POA: Insufficient documentation

## 2015-07-10 DIAGNOSIS — O4440 Low lying placenta NOS or without hemorrhage, unspecified trimester: Secondary | ICD-10-CM

## 2015-07-10 DIAGNOSIS — O283 Abnormal ultrasonic finding on antenatal screening of mother: Secondary | ICD-10-CM | POA: Diagnosis not present

## 2015-07-14 ENCOUNTER — Other Ambulatory Visit: Payer: Medicaid Other

## 2015-07-14 ENCOUNTER — Ambulatory Visit (INDEPENDENT_AMBULATORY_CARE_PROVIDER_SITE_OTHER): Payer: Medicaid Other | Admitting: Certified Nurse Midwife

## 2015-07-14 VITALS — BP 107/65 | HR 85 | Temp 97.7°F | Wt 146.0 lb

## 2015-07-14 DIAGNOSIS — Z3492 Encounter for supervision of normal pregnancy, unspecified, second trimester: Secondary | ICD-10-CM

## 2015-07-14 LAB — POCT URINALYSIS DIPSTICK
BILIRUBIN UA: NEGATIVE
Glucose, UA: NEGATIVE
Ketones, UA: NEGATIVE
NITRITE UA: NEGATIVE
PH UA: 6
RBC UA: NEGATIVE
Spec Grav, UA: 1.015
UROBILINOGEN UA: NEGATIVE

## 2015-07-14 NOTE — Progress Notes (Signed)
Patient has no concerns 

## 2015-07-14 NOTE — Addendum Note (Signed)
Addended by: Elby BeckPAUL, Blayke Pinera F on: 07/14/2015 11:49 AM   Modules accepted: Orders

## 2015-07-14 NOTE — Progress Notes (Signed)
Subjective:    Annette Whitehead is a 35 y.o. female being seen today for her obstetrical visit. She is at 7469w2d gestation. Patient reports: no complaints . Fetal movement: normal.  Problem List Items Addressed This Visit    None    Visit Diagnoses    Prenatal care, second trimester    -  Primary    Relevant Orders    POCT urinalysis dipstick      Patient Active Problem List   Diagnosis Date Noted  . Abnormal maternal serum screening test 05/15/2015  . Placenta previa antepartum in second trimester 04/22/2015  . Supervision of normal pregnancy in first trimester 03/29/2015   Objective:    BP 107/65 mmHg  Pulse 85  Temp(Src) 97.7 F (36.5 C)  Wt 146 lb (66.225 kg)  LMP 01/04/2015 (Exact Date) FHT: 155 BPM  Uterine Size: 29 cm and size equals dates     Assessment:    Pregnancy @ 1669w2d    Doing well  Plan:    OBGCT: discussed and ordered. Signs and symptoms of preterm labor: discussed.  Labs, problem list reviewed and updated 2 hr GTT today Follow up in 2 weeks.

## 2015-07-15 LAB — CBC
Hematocrit: 33 % — ABNORMAL LOW (ref 34.0–46.6)
Hemoglobin: 10.9 g/dL — ABNORMAL LOW (ref 11.1–15.9)
MCH: 29.2 pg (ref 26.6–33.0)
MCHC: 33 g/dL (ref 31.5–35.7)
MCV: 89 fL (ref 79–97)
PLATELETS: 155 10*3/uL (ref 150–379)
RBC: 3.73 x10E6/uL — ABNORMAL LOW (ref 3.77–5.28)
RDW: 13.5 % (ref 12.3–15.4)
WBC: 6.9 10*3/uL (ref 3.4–10.8)

## 2015-07-15 LAB — GLUCOSE TOLERANCE, 2 HOURS W/ 1HR
GLUCOSE, 1 HOUR: 135 mg/dL (ref 65–179)
GLUCOSE, 2 HOUR: 136 mg/dL (ref 65–152)
GLUCOSE, FASTING: 96 mg/dL — AB (ref 65–91)

## 2015-07-15 LAB — RPR: RPR: NONREACTIVE

## 2015-07-15 LAB — HIV ANTIBODY (ROUTINE TESTING W REFLEX): HIV SCREEN 4TH GENERATION: NONREACTIVE

## 2015-07-18 ENCOUNTER — Other Ambulatory Visit: Payer: Self-pay | Admitting: Certified Nurse Midwife

## 2015-07-18 DIAGNOSIS — O99013 Anemia complicating pregnancy, third trimester: Secondary | ICD-10-CM

## 2015-07-18 MED ORDER — VITAFOL FE+ 90-1-200 & 50 MG PO CPPK: 2.0000 | ORAL_CAPSULE | Freq: Every day | ORAL | Status: DC

## 2015-07-19 ENCOUNTER — Encounter: Payer: Self-pay | Admitting: *Deleted

## 2015-07-28 ENCOUNTER — Ambulatory Visit (INDEPENDENT_AMBULATORY_CARE_PROVIDER_SITE_OTHER): Payer: Medicaid Other | Admitting: Certified Nurse Midwife

## 2015-07-28 VITALS — BP 110/58 | HR 85 | Temp 98.2°F | Wt 147.0 lb

## 2015-07-28 DIAGNOSIS — Z3493 Encounter for supervision of normal pregnancy, unspecified, third trimester: Secondary | ICD-10-CM

## 2015-07-28 LAB — POCT URINALYSIS DIPSTICK
BILIRUBIN UA: NEGATIVE
Blood, UA: NEGATIVE
GLUCOSE UA: NEGATIVE
Ketones, UA: NEGATIVE
LEUKOCYTES UA: NEGATIVE
NITRITE UA: NEGATIVE
Protein, UA: NEGATIVE
Spec Grav, UA: 1.01
UROBILINOGEN UA: NEGATIVE
pH, UA: 6

## 2015-07-28 NOTE — Progress Notes (Signed)
Patient has  Concern about being really tired. But she found out she has anemia

## 2015-07-28 NOTE — Progress Notes (Signed)
Subjective:    Annette Whitehead is a 35 y.o. female being seen today for her obstetrical visit. She is at 1947w2d gestation. Patient reports no complaints. Fetal movement: normal.  Problem List Items Addressed This Visit    None    Visit Diagnoses    Prenatal care, third trimester    -  Primary    Relevant Orders    POCT urinalysis dipstick (Completed)      Patient Active Problem List   Diagnosis Date Noted  . Abnormal maternal serum screening test 05/15/2015  . Placenta previa antepartum in second trimester 04/22/2015  . Supervision of normal pregnancy in first trimester 03/29/2015   Objective:    BP 110/58 mmHg  Pulse 85  Temp(Src) 98.2 F (36.8 C)  Wt 147 lb (66.679 kg)  LMP 01/04/2015 (Exact Date) FHT:  160 BPM  Uterine Size: size equals dates  Presentation: cephalic     Assessment:    Pregnancy @ 7547w2d weeks   Anemic: on Vitafol FE Plan:     labs reviewed, problem list updated Consent signed. GBS sent TDAP offered  Rhogam given for RH negative Pediatrician: discussed. Infant feeding: plans to breastfeed. Maternity leave: discussed. Cigarette smoking: never smoked. Orders Placed This Encounter  Procedures  . POCT urinalysis dipstick   No orders of the defined types were placed in this encounter.   Follow up in 2 Weeks.

## 2015-08-11 ENCOUNTER — Ambulatory Visit (INDEPENDENT_AMBULATORY_CARE_PROVIDER_SITE_OTHER): Payer: Medicaid Other | Admitting: Certified Nurse Midwife

## 2015-08-11 VITALS — BP 109/73 | HR 89 | Wt 148.0 lb

## 2015-08-11 DIAGNOSIS — Z3483 Encounter for supervision of other normal pregnancy, third trimester: Secondary | ICD-10-CM

## 2015-08-11 LAB — POCT URINALYSIS DIPSTICK
BILIRUBIN UA: NEGATIVE
Glucose, UA: NEGATIVE
KETONES UA: NEGATIVE
Leukocytes, UA: NEGATIVE
Nitrite, UA: NEGATIVE
PH UA: 7
Protein, UA: NEGATIVE
RBC UA: NEGATIVE
SPEC GRAV UA: 1.01
Urobilinogen, UA: NEGATIVE

## 2015-08-11 NOTE — Progress Notes (Signed)
Subjective:    Annette Whitehead is a 35 y.o. female being seen today for her obstetrical visit. She is at 1659w2d gestation. Patient reports no complaints. Fetal movement: normal.  Problem List Items Addressed This Visit    None    Visit Diagnoses    Encounter for supervision of other normal pregnancy in third trimester    -  Primary    Relevant Orders    POCT urinalysis dipstick (Completed)      Patient Active Problem List   Diagnosis Date Noted  . Abnormal maternal serum screening test 05/15/2015  . Placenta previa antepartum in second trimester 04/22/2015  . Supervision of normal pregnancy in first trimester 03/29/2015   Objective:    BP 109/73 mmHg  Pulse 89  Wt 148 lb (67.132 kg)  LMP 01/04/2015 (Exact Date) FHT:  160 BPM  Uterine Size: 32 cm and size greater than dates  Presentation: cephalic     Assessment:    Pregnancy @ 2659w2d weeks   Doing well  Plan:     labs reviewed, problem list updated Consent signed. GBS planning TDAP offered  Rhogam given for RH negative Pediatrician: discussed. Infant feeding: plans to breastfeed. Maternity leave: N/A. Cigarette smoking: never smoked. Orders Placed This Encounter  Procedures  . POCT urinalysis dipstick   No orders of the defined types were placed in this encounter.   Follow up in 2 Weeks.

## 2015-08-14 ENCOUNTER — Ambulatory Visit
Admission: RE | Admit: 2015-08-14 | Discharge: 2015-08-14 | Disposition: A | Discharge status: Home / Self Care | Payer: Medicaid Other | Source: Ambulatory Visit | Attending: Obstetrics and Gynecology | Admitting: Obstetrics and Gynecology

## 2015-08-14 DIAGNOSIS — N63 Unspecified lump in unspecified breast: Secondary | ICD-10-CM

## 2015-08-25 ENCOUNTER — Encounter: Payer: Medicaid Other | Admitting: Certified Nurse Midwife

## 2015-08-30 ENCOUNTER — Ambulatory Visit (INDEPENDENT_AMBULATORY_CARE_PROVIDER_SITE_OTHER): Payer: Medicaid Other | Admitting: Certified Nurse Midwife

## 2015-08-30 VITALS — BP 103/61 | HR 99 | Temp 98.2°F | Wt 146.0 lb

## 2015-08-30 DIAGNOSIS — Z3493 Encounter for supervision of normal pregnancy, unspecified, third trimester: Secondary | ICD-10-CM

## 2015-08-30 LAB — POCT URINALYSIS DIPSTICK
Bilirubin, UA: NEGATIVE
Glucose, UA: NEGATIVE
Nitrite, UA: NEGATIVE
PROTEIN UA: NEGATIVE
RBC UA: NEGATIVE
SPEC GRAV UA: 1.01
UROBILINOGEN UA: NEGATIVE
pH, UA: 6

## 2015-08-30 NOTE — Progress Notes (Signed)
Subjective:    Annette Whitehead is a 35 y.o. female being seen today for her obstetrical visit. She is at 4752w0d gestation. Patient reports no complaints. Fetal movement: normal.  Problem List Items Addressed This Visit    None    Visit Diagnoses    Prenatal care, third trimester    -  Primary    Relevant Orders    POCT urinalysis dipstick (Completed)      Patient Active Problem List   Diagnosis Date Noted  . Abnormal maternal serum screening test 05/15/2015  . Placenta previa antepartum in second trimester 04/22/2015  . Supervision of normal pregnancy in first trimester 03/29/2015   Objective:    BP 103/61 mmHg  Pulse 99  Temp(Src) 98.2 F (36.8 C)  Wt 146 lb (66.225 kg)  LMP 01/04/2015 (Exact Date) FHT:  150 BPM  Uterine Size: size equals dates  Presentation: cephalic     Assessment:    Pregnancy @ 8152w0d weeks   Doing well  Plan:     labs reviewed, problem list updated Consent signed. GBS planning TDAP offered  Rhogam given for RH negative Pediatrician: discussed. Infant feeding: plans to breastfeed. Maternity leave: discussed. Cigarette smoking: never smoked. Orders Placed This Encounter  Procedures  . POCT urinalysis dipstick   No orders of the defined types were placed in this encounter.   Follow up in 2 Weeks with GBS.

## 2015-09-13 ENCOUNTER — Ambulatory Visit (INDEPENDENT_AMBULATORY_CARE_PROVIDER_SITE_OTHER): Payer: Medicaid Other | Admitting: Certified Nurse Midwife

## 2015-09-13 VITALS — BP 107/68 | HR 99 | Wt 152.0 lb

## 2015-09-13 DIAGNOSIS — O368131 Decreased fetal movements, third trimester, fetus 1: Secondary | ICD-10-CM | POA: Diagnosis not present

## 2015-09-13 DIAGNOSIS — Z3483 Encounter for supervision of other normal pregnancy, third trimester: Secondary | ICD-10-CM

## 2015-09-13 NOTE — Patient Instructions (Addendum)
Group B streptococcus (GBS) is a type of bacteria often found in healthy women. GBS is not the same as the bacteria that causes strep throat. You may have GBS in your vagina, rectum, or bladder. GBS does not spread through sexual contact, but it can be passed to a baby during childbirth. This can be dangerous for your baby. It is not dangerous to you and usually does not cause any symptoms. Your health care provider may test you for GBS when your pregnancy is between 35 and 37 weeks. GBS is dangerous only during birth, so there is no need to test for it earlier. It is possible to have GBS during pregnancy and never pass it to your baby. If your test results are positive for GBS, your health care provider may recommend giving you antibiotic medicine during delivery to make sure your baby stays healthy. RISK FACTORS You are more likely to pass GBS to your baby if:   Your water breaks (ruptured membrane) or you go into labor before 37 weeks.  Your water breaks 18 hours before you deliver.  You passed GBS during a previous pregnancy.  You have a urinary tract infection caused by GBS any time during pregnancy.  You have a fever during labor. SYMPTOMS Most women who have GBS do not have any symptoms. If you have a urinary tract infection caused by GBS, you might have frequent or painful urination and fever. Babies who get GBS usually show symptoms within 7 days of birth. Symptoms may include:   Breathing problems.  Heart and blood pressure problems.  Digestive and kidney problems. DIAGNOSIS Routine screening for GBS is recommended for all pregnant women. A health care provider takes a sample of the fluid in your vagina and rectum with a swab. It is then sent to a lab to be checked for GBS. A sample of your urine may also be checked for the bacteria.  TREATMENT If you test positive for GBS, you may need treatment with an antibiotic medicine during labor. As soon as you go into labor, or as soon as  your membranes rupture, you will get the antibiotic medicine through an IV access. You will continue to get the medicine until after you give birth. You do not need antibiotic medicine if you are having a cesarean delivery.If your baby shows signs or symptoms of GBS after birth, your baby can also be treated with an antibiotic medicine. HOME CARE INSTRUCTIONS   Take all antibiotic medicine as prescribed by your health care provider. Only take medicine as directed.   Continue with prenatal visits and care.   Keep all follow-up appointments.  SEEK MEDICAL CARE IF:   You have pain when you urinate.   You have to urinate frequently.   You have a fever.  SEEK IMMEDIATE MEDICAL CARE IF:   Your membranes rupture.  You go into labor.   This information is not intended to replace advice given to you by your health care provider. Make sure you discuss any questions you have with your health care provider.   Document Released: 06/25/2007 Document Revised: 03/23/2013 Document Reviewed: 01/08/2013 Elsevier Interactive Patient Education 2016 [employer] Inc[employer]  

## 2015-09-13 NOTE — Progress Notes (Signed)
Subjective:    Annette Whitehead is a 35 y.o. female being seen today for her obstetrical visit. She is at 4233w0d gestation. Patient reports no bleeding, no contractions, no cramping, no leaking and vaginal pressure. Fetal movement: decreased.  Problem List Items Addressed This Visit    None    Visit Diagnoses    Encounter for supervision of other normal pregnancy in third trimester    -  Primary    Relevant Orders    POCT urinalysis dipstick    Strep Gp B NAA    NuSwab VG+, Candida 6sp      Patient Active Problem List   Diagnosis Date Noted  . Abnormal maternal serum screening test 05/15/2015  . Placenta previa antepartum in second trimester 04/22/2015  . Supervision of normal pregnancy in first trimester 03/29/2015   Objective:    BP 107/68 mmHg  Pulse 99  Wt 152 lb (68.947 kg)  LMP 01/04/2015 (Exact Date) FHT:  160's by doppler BPM  Uterine Size: size equals dates  Presentation: cephalic   Cervix: 0.5cm, long, soft,posterior  NST: + accels, no decels, moderate variability, Cat. 1 tracing. No contractions on toco.  Assessment:    Pregnancy @ 4333w0d weeks   Reactive NST  Plan:     labs reviewed, problem list updated Consent signed. GBS sent TDAP offered  Rhogam given for RH negative Pediatrician: discussed. Infant feeding: plans to breastfeed. Maternity leave: N/A. Cigarette smoking: never smoked. Orders Placed This Encounter  Procedures  . Strep Gp B NAA  . POCT urinalysis dipstick   No orders of the defined types were placed in this encounter.   Follow up in 1 Week.

## 2015-09-14 LAB — POCT URINALYSIS DIPSTICK
BILIRUBIN UA: NEGATIVE
Blood, UA: NEGATIVE
Glucose, UA: NEGATIVE
Ketones, UA: NEGATIVE
LEUKOCYTES UA: NEGATIVE
Nitrite, UA: NEGATIVE
PH UA: 6
PROTEIN UA: NEGATIVE
Spec Grav, UA: 1.015
Urobilinogen, UA: NEGATIVE

## 2015-09-14 LAB — OB RESULTS CONSOLE GBS: STREP GROUP B AG: NEGATIVE

## 2015-09-15 LAB — STREP GP B NAA: Strep Gp B NAA: NEGATIVE

## 2015-09-17 LAB — NUSWAB VG+, CANDIDA 6SP
CANDIDA ALBICANS, NAA: NEGATIVE
CANDIDA GLABRATA, NAA: NEGATIVE
CHLAMYDIA TRACHOMATIS, NAA: NEGATIVE
Candida krusei, NAA: NEGATIVE
Candida lusitaniae, NAA: NEGATIVE
Candida parapsilosis, NAA: NEGATIVE
Candida tropicalis, NAA: NEGATIVE
Neisseria gonorrhoeae, NAA: NEGATIVE
TRICH VAG BY NAA: NEGATIVE

## 2015-09-20 ENCOUNTER — Ambulatory Visit (INDEPENDENT_AMBULATORY_CARE_PROVIDER_SITE_OTHER): Payer: Medicaid Other | Admitting: Certified Nurse Midwife

## 2015-09-20 VITALS — BP 114/73 | HR 84 | Wt 151.0 lb

## 2015-09-20 DIAGNOSIS — Z3483 Encounter for supervision of other normal pregnancy, third trimester: Secondary | ICD-10-CM

## 2015-09-20 LAB — POCT URINALYSIS DIPSTICK
BILIRUBIN UA: NEGATIVE
GLUCOSE UA: NEGATIVE
Ketones, UA: NEGATIVE
LEUKOCYTES UA: NEGATIVE
NITRITE UA: NEGATIVE
PH UA: 6
Protein, UA: NEGATIVE
RBC UA: NEGATIVE
Spec Grav, UA: 1.02
UROBILINOGEN UA: NEGATIVE

## 2015-09-20 NOTE — Progress Notes (Signed)
Subjective:    Annette Whitehead is a 35 y.o. female being seen today for her obstetrical visit. She is at 6262w0d gestation. Patient reports no complaints. Fetal movement: normal.  Problem List Items Addressed This Visit    None    Visit Diagnoses    Encounter for supervision of other normal pregnancy in third trimester    -  Primary    Relevant Orders    POCT urinalysis dipstick (Completed)      Patient Active Problem List   Diagnosis Date Noted  . Abnormal maternal serum screening test 05/15/2015  . Placenta previa antepartum in second trimester 04/22/2015  . Supervision of normal pregnancy in first trimester 03/29/2015    Objective:    BP 114/73 mmHg  Pulse 84  Wt 151 lb (68.493 kg)  LMP 01/04/2015 (Exact Date) FHT: 155 BPM  Uterine Size: 38 cm and size equals dates  Presentations: cephalic  Pelvic Exam: deferred     Assessment:    Pregnancy @ 4562w0d weeks   Doing well  Plan:   Plans for delivery: C/Section scheduled; labs reviewed; problem list updated Counseling: Consent signed. Infant feeding: plans to breastfeed. Cigarette smoking: never smoked. L&D discussion: symptoms of labor, discussed when to call, discussed what number to call, anesthetic/analgesic options reviewed and delivering clinician:  plans Certified Nurse-Midwife. Postpartum supports and preparation: circumcision discussed and contraception plans discussed.  Follow up in 1 Week.

## 2015-09-26 ENCOUNTER — Inpatient Hospital Stay (HOSPITAL_COMMUNITY)
Admission: AD | Admit: 2015-09-26 | Discharge: 2015-09-27 | Disposition: A | Discharge status: Home / Self Care | Payer: Medicaid Other | Source: Ambulatory Visit | Attending: Obstetrics | Admitting: Obstetrics

## 2015-09-26 ENCOUNTER — Encounter (HOSPITAL_COMMUNITY): Payer: Self-pay

## 2015-09-26 DIAGNOSIS — Z3A37 37 weeks gestation of pregnancy: Secondary | ICD-10-CM | POA: Insufficient documentation

## 2015-09-26 DIAGNOSIS — R1011 Right upper quadrant pain: Secondary | ICD-10-CM

## 2015-09-26 DIAGNOSIS — O9989 Other specified diseases and conditions complicating pregnancy, childbirth and the puerperium: Secondary | ICD-10-CM | POA: Diagnosis not present

## 2015-09-26 DIAGNOSIS — O26893 Other specified pregnancy related conditions, third trimester: Secondary | ICD-10-CM | POA: Insufficient documentation

## 2015-09-26 DIAGNOSIS — K219 Gastro-esophageal reflux disease without esophagitis: Secondary | ICD-10-CM | POA: Diagnosis not present

## 2015-09-26 DIAGNOSIS — O99613 Diseases of the digestive system complicating pregnancy, third trimester: Secondary | ICD-10-CM | POA: Insufficient documentation

## 2015-09-26 LAB — COMPREHENSIVE METABOLIC PANEL
ALK PHOS: 148 U/L — AB (ref 38–126)
ALT: 13 U/L — ABNORMAL LOW (ref 14–54)
ANION GAP: 7 (ref 5–15)
AST: 18 U/L (ref 15–41)
Albumin: 3.2 g/dL — ABNORMAL LOW (ref 3.5–5.0)
BILIRUBIN TOTAL: 0.6 mg/dL (ref 0.3–1.2)
BUN: 7 mg/dL (ref 6–20)
CALCIUM: 8.6 mg/dL — AB (ref 8.9–10.3)
CO2: 22 mmol/L (ref 22–32)
Chloride: 104 mmol/L (ref 101–111)
Creatinine, Ser: 0.46 mg/dL (ref 0.44–1.00)
GLUCOSE: 101 mg/dL — AB (ref 65–99)
POTASSIUM: 3.7 mmol/L (ref 3.5–5.1)
Sodium: 133 mmol/L — ABNORMAL LOW (ref 135–145)
TOTAL PROTEIN: 6.2 g/dL — AB (ref 6.5–8.1)

## 2015-09-26 LAB — PROTEIN / CREATININE RATIO, URINE: CREATININE, URINE: 29 mg/dL

## 2015-09-26 LAB — URINALYSIS, ROUTINE W REFLEX MICROSCOPIC
BILIRUBIN URINE: NEGATIVE
GLUCOSE, UA: NEGATIVE mg/dL
HGB URINE DIPSTICK: NEGATIVE
KETONES UR: NEGATIVE mg/dL
Nitrite: NEGATIVE
PROTEIN: NEGATIVE mg/dL
Specific Gravity, Urine: <1.005 — ABNORMAL LOW (ref 1.005–1.030)
pH: 6 (ref 5.0–8.0)

## 2015-09-26 LAB — URINE MICROSCOPIC-ADD ON

## 2015-09-26 LAB — CBC
HEMATOCRIT: 32.8 % — AB (ref 36.0–46.0)
HEMOGLOBIN: 10.8 g/dL — AB (ref 12.0–15.0)
MCH: 26 pg (ref 26.0–34.0)
MCHC: 32.9 g/dL (ref 30.0–36.0)
MCV: 78.8 fL (ref 78.0–100.0)
Platelets: 145 10*3/uL — ABNORMAL LOW (ref 150–400)
RBC: 4.16 MIL/uL (ref 3.87–5.11)
RDW: 14.1 % (ref 11.5–15.5)
WBC: 8 10*3/uL (ref 4.0–10.5)

## 2015-09-26 LAB — LIPASE, BLOOD: Lipase: 17 U/L (ref 11–51)

## 2015-09-26 MED ORDER — LACTATED RINGERS IV BOLUS (SEPSIS)
1000.0000 mL | Freq: Once | INTRAVENOUS | Status: AC
  Administered 2015-09-26: 1000 mL via INTRAVENOUS

## 2015-09-26 MED ORDER — GI COCKTAIL ~~LOC~~
30.0000 mL | Freq: Once | ORAL | Status: AC
  Administered 2015-09-26: 30 mL via ORAL
  Filled 2015-09-26: qty 30

## 2015-09-26 NOTE — MAU Note (Signed)
Pt reports pain in her right upper abd that radiates to her mid back for 2 days. Denies nausea, vomiting or fever.

## 2015-09-26 NOTE — MAU Provider Note (Signed)
History     CSN: [id number]  Arrival date and time: 09/26/15 2056   First Provider Initiated Contact with Patient 09/26/15 2218      No chief complaint on file.  HPI   Annette Whitehead is a 35 y.o. female [phone] @ 14w6dhere with right upper quadrant pain that radiates all through her upper abdomen. The pain started 2 days ago. The pain comes and goes. The pain worsens with movement and hurts worse when she touches her abdomen or bends down. The pain worsened tonight after eating "spicy soup and pork". She has never had this pain before.  She currently rates her pain 5/10; when she stands up the pain increases to 9/10.  She denies vaginal bleeding She denies leaking of fluid + fetal movement.   OB History    Gravida Para Term Preterm AB TAB SAB Ectopic Multiple Living   _0 0 0 0 0 0 0 2      Past Medical History  Diagnosis Date  . Medical history non-contributory     Past Surgical History  Procedure Laterality Date  . No past surgeries      Family History  Problem Relation Age of Onset  . Diabetes Mother   . Hypertension Mother   . Hyperlipidemia Mother   . Osteoporosis Mother   . Diabetes Father     Social History  Substance Use Topics  . Smoking status: Never Smoker   . Smokeless tobacco: Never Used  . Alcohol Use: No    Allergies: No Known Allergies  Prescriptions prior to admission  Medication Sig Dispense Refill Last Dose  . Prenat-FePoly-Metf-FA-DHA-DSS (VITAFOL FE+) 90-1-200 & 50 MG CPPK Take 2 tablets by mouth daily. 60 each 12 09/25/2015 at Unknown time    Review of Systems  Constitutional: Negative for fever and chills.  Gastrointestinal: Positive for heartburn. Negative for nausea, vomiting, abdominal pain, diarrhea and constipation.   Physical Exam   Blood pressure 109/59, pulse 77, temperature 97.9 F (36.6 C), temperature source Oral, resp. rate 18, height 5' 1.5" (1.562 m), weight 153 lb (69.4 kg), last menstrual period  01/04/2015, SpO2 100 %.  Physical Exam  Constitutional: She is oriented to person, place, and time. She appears well-developed and well-nourished. No distress.  HENT:  Head: Normocephalic.  Eyes: Pupils are equal, round, and reactive to light.  Neck: Neck supple.  GI: Soft. Normal appearance. There is tenderness in the right upper quadrant and epigastric area. There is no rigidity, no rebound, no guarding and negative Murphy's sign.  Musculoskeletal: Normal range of motion.  Neurological: She is alert and oriented to person, place, and time.  Skin: Skin is warm. She is not diaphoretic.  Psychiatric: Her behavior is normal.  Fetal Tracing: Baseline: 150 bpm  Variability: Moderate  Accelerations: 15x15 Decelerations: none Toco: Irregular pattern    Results for orders placed or performed during the hospital encounter of 09/26/15 (from the past 48 hour(s))  [facility]inal[facility], [facility]     Status: Abnormal   Collection Time: 09/26/15  9:07 PM  Result Value Ref Range   Color, Urine YELLOW YELLOW   APPearance CLEAR CLEAR   Specific Gravity, Urine <1.005 (L) 1.005 - 1.030   pH 6.0 5.0 - 8.0   Glucose, UA NEGATIVE NEGATIVE mg/dL   Hgb urine dipstick NEGATIVE NEGATIVE   Bilirubin Urine NEGATIVE NEGATIVE   Ketones, ur NEGATIVE NEGATIVE mg/dL   Protein, ur NEGATIVE NEGATIVE mg/dL   Nitrite  NEGATIVE NEGATIVE   Leukocytes, UA SMALL (A) NEGATIVE  Urine microscopic-add on     Status: Abnormal   Collection Time: 09/26/15  9:07 PM  Result Value Ref Range   Squamous Epithelial / LPF 0-5 (A) NONE SEEN   WBC, UA 0-5 0 - 5 WBC/hpf   RBC / HPF 0-5 0 - 5 RBC/hpf   Bacteria, UA FEW (A) NONE SEEN  Protein / creatinine ratio, urine     Status: None   Collection Time: 09/26/15  9:07 PM  Result Value Ref Range   Creatinine, Urine 29.00 mg/dL   Total Protein, Urine <6 mg/dL    Comment: REPEATED TO VERIFY NO NORMAL RANGE ESTABLISHED FOR THIS TEST    Protein Creatinine  Ratio        0.00 - 0.15 mg/mg[Cre]    Comment: RESULT BELOW REPORTABLE RANGE, UNABLE TO CALCULATE.   CBC     Status: Abnormal   Collection Time: 09/26/15 10:38 PM  Result Value Ref Range   WBC 8.0 4.0 - 10.5 K/uL   RBC 4.16 3.87 - 5.11 MIL/uL   Hemoglobin 10.8 (L) 12.0 - 15.0 g/dL   HCT 32.8 (L) 36.0 - 46.0 %   MCV 78.8 78.0 - 100.0 fL   MCH 26.0 26.0 - 34.0 pg   MCHC 32.9 30.0 - 36.0 g/dL   RDW 14.1 11.5 - 15.5 %   Platelets 145 (L) 150 - 400 K/uL  Comprehensive metabolic panel     Status: Abnormal   Collection Time: 09/26/15 10:38 PM  Result Value Ref Range   Sodium 133 (L) 135 - 145 mmol/L   Potassium 3.7 3.5 - 5.1 mmol/L   Chloride 104 101 - 111 mmol/L   CO2 22 22 - 32 mmol/L   Glucose, Bld 101 (H) 65 - 99 mg/dL   BUN 7 6 - 20 mg/dL   Creatinine, Ser 0.46 0.44 - 1.00 mg/dL   Calcium 8.6 (L) 8.9 - 10.3 mg/dL   Total Protein 6.2 (L) 6.5 - 8.1 g/dL   Albumin 3.2 (L) 3.5 - 5.0 g/dL   AST 18 15 - 41 U/L   ALT 13 (L) 14 - 54 U/L   Alkaline Phosphatase 148 (H) 38 - 126 U/L   Total Bilirubin 0.6 0.3 - 1.2 mg/dL   GFR calc non Af Amer >60 >60 mL/min   GFR calc Af Amer >60 >60 mL/min    Comment: (NOTE) The eGFR has been calculated using the CKD EPI equation. This calculation has not been validated in all clinical situations. eGFR's persistently <60 mL/min signify possible Chronic Kidney Disease.    Anion gap 7 5 - 15  Lipase, blood     Status: None   Collection Time: 09/26/15 10:38 PM  Result Value Ref Range   Lipase 17 11 - 51 U/L    MAU Course  Procedures  None  MDM  Last CBC in April platelets were 155 Cbc CMP Protein creatine ration  Gi cocktail given: patient rates her pain 2/10; pain felt only when the patient stands up.    Assessment and Plan   A:  1. Gastroesophageal reflux disease, esophagitis presence not specified   2. Abdominal pain, right upper quadrant     P:  Discharge home in stable condition Contacted [place] regarding RUQ [place] scheduled  for this morning. [place] will call the patient to schedule first thing this morning. Results to be called to Dr. Jodi Mourning  MA:[id number] NPO X 6 hours prior to [place] Return to  MAU if symptoms worsen  Fetal kick counts    Lezlie Lye, NP 09/27/2015 7:27 AM

## 2015-09-27 ENCOUNTER — Other Ambulatory Visit (HOSPITAL_COMMUNITY): Payer: Self-pay | Admitting: Obstetrics and Gynecology

## 2015-09-27 ENCOUNTER — Ambulatory Visit (HOSPITAL_COMMUNITY)
Admission: RE | Admit: 2015-09-27 | Discharge: 2015-09-27 | Disposition: A | Discharge status: Home / Self Care | Payer: Medicaid Other | Source: Ambulatory Visit | Attending: Obstetrics and Gynecology | Admitting: Obstetrics and Gynecology

## 2015-09-27 DIAGNOSIS — K219 Gastro-esophageal reflux disease without esophagitis: Secondary | ICD-10-CM | POA: Diagnosis not present

## 2015-09-27 DIAGNOSIS — R1011 Right upper quadrant pain: Secondary | ICD-10-CM

## 2015-09-27 DIAGNOSIS — O9989 Other specified diseases and conditions complicating pregnancy, childbirth and the puerperium: Secondary | ICD-10-CM

## 2015-09-27 DIAGNOSIS — Z3A38 38 weeks gestation of pregnancy: Secondary | ICD-10-CM

## 2015-09-27 DIAGNOSIS — Z3A36 36 weeks gestation of pregnancy: Secondary | ICD-10-CM | POA: Insufficient documentation

## 2015-09-27 MED ORDER — RANITIDINE HCL 150 MG PO TABS: 150.0000 mg | ORAL_TABLET | Freq: Two times a day (BID) | ORAL | Status: DC

## 2015-09-27 NOTE — Discharge Instructions (Signed)
Food Choices for Gastroesophageal Reflux Disease, Adult When you have gastroesophageal reflux disease (GERD), the foods you eat and your eating habits are very important. Choosing the right foods can help ease the discomfort of GERD. WHAT GENERAL GUIDELINES DO I NEED TO FOLLOW?  Choose fruits, vegetables, whole grains, low-fat dairy products, and low-fat meat, fish, and poultry.  Limit fats such as oils, salad dressings, butter, nuts, and avocado.  Keep a food diary to identify foods that cause symptoms.  Avoid foods that cause reflux. These may be different for different people.  Eat frequent small meals instead of three large meals each day.  Eat your meals slowly, in a relaxed setting.  Limit fried foods.  Cook foods using methods other than frying.  Avoid drinking alcohol.  Avoid drinking large amounts of liquids with your meals.  Avoid bending over or lying down until 2-3 hours after eating. WHAT FOODS ARE NOT RECOMMENDED? The following are some foods and drinks that may worsen your symptoms: Vegetables Tomatoes. Tomato juice. Tomato and spaghetti sauce. Chili peppers. Onion and garlic. Horseradish. Fruits Oranges, grapefruit, and lemon (fruit and juice). Meats High-fat meats, fish, and poultry. This includes hot dogs, ribs, ham, sausage, salami, and bacon. Dairy Whole milk and chocolate milk. Sour cream. Cream. Butter. Ice cream. Cream cheese.  Beverages Coffee and tea, with or without caffeine. Carbonated beverages or energy drinks. Condiments Hot sauce. Barbecue sauce.  Sweets/Desserts Chocolate and cocoa. Donuts. Peppermint and spearmint. Fats and Oils High-fat foods, including French fries and potato chips. Other Vinegar. Strong spices, such as black pepper, white pepper, red pepper, cayenne, curry powder, cloves, ginger, and chili powder. The items listed above may not be a complete list of foods and beverages to avoid. Contact your dietitian for more  information.   This information is not intended to replace advice given to you by your health care provider. Make sure you discuss any questions you have with your health care provider.   Document Released: 03/18/2005 Document Revised: 04/08/2014 Document Reviewed: 01/20/2013 Elsevier Interactive Patient Education 2016 [employer] Inc.  

## 2015-09-28 ENCOUNTER — Ambulatory Visit (INDEPENDENT_AMBULATORY_CARE_PROVIDER_SITE_OTHER): Payer: Medicaid Other | Admitting: Certified Nurse Midwife

## 2015-09-28 VITALS — BP 114/66 | HR 93 | Wt 153.0 lb

## 2015-09-28 DIAGNOSIS — Z3493 Encounter for supervision of normal pregnancy, unspecified, third trimester: Secondary | ICD-10-CM | POA: Diagnosis not present

## 2015-09-28 LAB — POCT URINALYSIS DIPSTICK
BILIRUBIN UA: NEGATIVE
Glucose, UA: NEGATIVE
Leukocytes, UA: NEGATIVE
NITRITE UA: NEGATIVE
PH UA: 7
Protein, UA: NEGATIVE
RBC UA: NEGATIVE
SPEC GRAV UA: 1.01
UROBILINOGEN UA: NEGATIVE

## 2015-09-28 NOTE — Progress Notes (Signed)
I agree with note by NP Student Andrew Brake.  Was present for exam.  R.Nicki Gracy CNM 

## 2015-09-28 NOTE — Progress Notes (Signed)
Subjective:    Annette Whitehead is a 35 y.o. female being seen today for her obstetrical visit. She is at 3380w1d gestation. Patient reports fatigue, heartburn, no bleeding, no cramping, no leaking and vaginal irritation.  Reports avoiding spicy foods. Fetal movement: normal.  Problem List Items Addressed This Visit    None    Visit Diagnoses    Prenatal care, third trimester    -  Primary    Relevant Orders    POCT urinalysis dipstick (Completed)      Patient Active Problem List   Diagnosis Date Noted  . Abnormal maternal serum screening test 05/15/2015  . Placenta previa antepartum in second trimester 04/22/2015  . Supervision of normal pregnancy in first trimester 03/29/2015    Objective:    BP 114/66 mmHg  Pulse 93  Wt 153 lb (69.4 kg)  LMP 01/04/2015 (Exact Date) FHT: 145 BPM  Uterine Size: size equals dates  Presentations: cephalic     Assessment:    Pregnancy @ 3280w1d weeks    Acid reflux.  Plan:   Plans for delivery: Vaginal anticipated; labs reviewed; problem list updated Counseling: Consent signed. Infant feeding: plans to breastfeed. L&D discussion: symptoms of labor, discussed when to call, discussed what number to call, anesthetic/analgesic options reviewed. Postpartum supports and preparation: circumcision discussed and contraception plans discussed. Zantac for acid reflux.  Follow up in 1 Week.

## 2015-10-01 ENCOUNTER — Inpatient Hospital Stay (HOSPITAL_COMMUNITY)
Admission: AD | Admit: 2015-10-01 | Discharge: 2015-10-03 | DRG: 775 | Disposition: A | Discharge status: Home / Self Care | Payer: Medicaid Other | Source: Ambulatory Visit | Attending: Obstetrics & Gynecology | Admitting: Obstetrics & Gynecology

## 2015-10-01 ENCOUNTER — Encounter (HOSPITAL_COMMUNITY): Payer: Self-pay | Admitting: *Deleted

## 2015-10-01 DIAGNOSIS — IMO0001 Reserved for inherently not codable concepts without codable children: Secondary | ICD-10-CM

## 2015-10-01 DIAGNOSIS — Z8262 Family history of osteoporosis: Secondary | ICD-10-CM

## 2015-10-01 DIAGNOSIS — O9962 Diseases of the digestive system complicating childbirth: Secondary | ICD-10-CM | POA: Diagnosis present

## 2015-10-01 DIAGNOSIS — Z8249 Family history of ischemic heart disease and other diseases of the circulatory system: Secondary | ICD-10-CM | POA: Diagnosis not present

## 2015-10-01 DIAGNOSIS — Z833 Family history of diabetes mellitus: Secondary | ICD-10-CM

## 2015-10-01 DIAGNOSIS — Z3A38 38 weeks gestation of pregnancy: Secondary | ICD-10-CM

## 2015-10-01 DIAGNOSIS — O4202 Full-term premature rupture of membranes, onset of labor within 24 hours of rupture: Principal | ICD-10-CM | POA: Diagnosis present

## 2015-10-01 DIAGNOSIS — K219 Gastro-esophageal reflux disease without esophagitis: Secondary | ICD-10-CM | POA: Diagnosis present

## 2015-10-01 HISTORY — DX: Anemia, unspecified: D64.9

## 2015-10-01 HISTORY — DX: Headache, unspecified: R51.9

## 2015-10-01 HISTORY — DX: Headache: R51

## 2015-10-01 HISTORY — DX: Gastro-esophageal reflux disease without esophagitis: K21.9

## 2015-10-01 LAB — CBC
HCT: 34.1 % — ABNORMAL LOW (ref 36.0–46.0)
Hemoglobin: 11.3 g/dL — ABNORMAL LOW (ref 12.0–15.0)
MCH: 25.8 pg — AB (ref 26.0–34.0)
MCHC: 33.1 g/dL (ref 30.0–36.0)
MCV: 77.9 fL — ABNORMAL LOW (ref 78.0–100.0)
PLATELETS: 150 10*3/uL (ref 150–400)
RBC: 4.38 MIL/uL (ref 3.87–5.11)
RDW: 14.1 % (ref 11.5–15.5)
WBC: 7.7 10*3/uL (ref 4.0–10.5)

## 2015-10-01 LAB — TYPE AND SCREEN
ABO/RH(D): A POS
Antibody Screen: NEGATIVE

## 2015-10-01 LAB — POCT FERN TEST: POCT FERN TEST: POSITIVE

## 2015-10-01 MED ORDER — OXYCODONE-ACETAMINOPHEN 5-325 MG PO TABS: 1.0000 | ORAL_TABLET | ORAL | Status: DC | PRN

## 2015-10-01 MED ORDER — OXYCODONE-ACETAMINOPHEN 5-325 MG PO TABS: 2.0000 | ORAL_TABLET | ORAL | Status: DC | PRN

## 2015-10-01 MED ORDER — OXYTOCIN BOLUS FROM INFUSION
500.0000 mL | INTRAVENOUS | Status: DC
  Administered 2015-10-02: 500 mL via INTRAVENOUS

## 2015-10-01 MED ORDER — SOD CITRATE-CITRIC ACID 500-334 MG/5ML PO SOLN: 30.0000 mL | ORAL | Status: DC | PRN

## 2015-10-01 MED ORDER — LIDOCAINE HCL (PF) 1 % IJ SOLN
30.0000 mL | INTRAMUSCULAR | Status: DC | PRN
  Filled 2015-10-01: qty 30

## 2015-10-01 MED ORDER — FLEET ENEMA 7-19 GM/118ML RE ENEM: 1.0000 | ENEMA | RECTAL | Status: DC | PRN

## 2015-10-01 MED ORDER — LACTATED RINGERS IV SOLN: 500.0000 mL | INTRAVENOUS | Status: DC | PRN

## 2015-10-01 MED ORDER — LACTATED RINGERS IV SOLN
INTRAVENOUS | Status: DC
  Administered 2015-10-01 – 2015-10-02 (×2): via INTRAVENOUS

## 2015-10-01 MED ORDER — OXYTOCIN 40 UNITS IN LACTATED RINGERS INFUSION - SIMPLE MED: 2.5000 [IU]/h | INTRAVENOUS | Status: DC

## 2015-10-01 MED ORDER — ACETAMINOPHEN 325 MG PO TABS: 650.0000 mg | ORAL_TABLET | ORAL | Status: DC | PRN

## 2015-10-01 MED ORDER — ONDANSETRON HCL 4 MG/2ML IJ SOLN: 4.0000 mg | Freq: Four times a day (QID) | INTRAMUSCULAR | Status: DC | PRN

## 2015-10-01 NOTE — H&P (Signed)
LABOR ADMISSION HISTORY AND PHYSICAL  Annette Whitehead is a 35 y.o. female G3P2002 with IUP at 360w4d by LMP presenting for SROM @2130 . She reports +FM, no contractions, no VB, no blurry vision, no headaches or peripheral edema, and no RUQ pain.  She plans on breast feeding. She request IUD for birth control. Fern positive in MAU.  Dating: By LMP consistent with early US --->  Estimated Date of Delivery: 10/11/15  Sono:    @[redacted]w[redacted]d , CWD, normal anatomy, cephalic presentation, low lying placenta, 225g, 34% EFW @ 26wk 5d, 942g, 49% EFW. Placenta previa RESOLVED (07/10/15)  Prenatal History/Complications: None  Reports history of retained placenta   Past Medical History: Past Medical History  Diagnosis Date  . Medical history non-contributory   . Anemia   . GERD (gastroesophageal reflux disease)   . Headache     Past Surgical History: Past Surgical History  Procedure Laterality Date  . No past surgeries      Obstetrical History: OB History    Gravida Para Term Preterm AB TAB SAB Ectopic Multiple Living   3 2 2  0 0 0 0 0 0 2      Social History: Social History   Social History  . Marital Status: Married    Spouse Name: N/A  . Number of Children: N/A  . Years of Education: N/A   Social History Main Topics  . Smoking status: Never Smoker   . Smokeless tobacco: Never Used  . Alcohol Use: No  . Drug Use: No  . Sexual Activity: Yes    Birth Control/ Protection: None   Other Topics Concern  . None   Social History Narrative    Family History: Family History  Problem Relation Age of Onset  . Diabetes Mother   . Hypertension Mother   . Hyperlipidemia Mother   . Osteoporosis Mother   . Diabetes Father     Allergies: No Known Allergies  Prescriptions prior to admission  Medication Sig Dispense Refill Last Dose  . Prenat-FePoly-Metf-FA-DHA-DSS (VITAFOL FE+) 90-1-200 & 50 MG CPPK Take 2 tablets by mouth daily. 60 each 12 Past Week at Unknown time  .  ranitidine (ZANTAC) 150 MG tablet Take 1 tablet (150 mg total) by mouth 2 (two) times daily. 60 tablet 0 Past Month at Unknown time     Review of Systems   All systems reviewed and negative except as stated in HPI  BP 121/86 mmHg  Pulse 90  Temp(Src) 98.5 F (36.9 C) (Oral)  Resp 16  Ht 5\' 1"  (1.549 m)  Wt 68.947 kg (152 lb)  BMI 28.74 kg/m2  LMP 01/04/2015 (Exact Date) General appearance: alert and no distress Lungs: clear to auscultation bilaterally Heart: regular rate and rhythm Abdomen: soft, non-tender; bowel sounds normal Extremities: Homans sign is negative, no sign of DVT, edema Presentation: cephalic Fetal monitoringBaseline: 145-150 bpm, Variability: Good {> 6 bpm) and Accelerations: Reactive Uterine activityFrequency: Every 5 minutes Dilation: 2.5 Effacement (%): 60 Station: -3 Exam by:: K. WeissRN   Prenatal labs: ABO, Rh: A/POS/-- (12/28 1000) Antibody: NEG (12/28 1000) Rubella: Immune RPR: Non Reactive (04/14 1040)  HBsAg: NEGATIVE (12/28 1000)  HIV: Non Reactive (04/14 1040)  GBS: Negative (06/15 0000)  1 hr Glucola: fasting 96, 1hr 135, 2hr 136 Genetic screening  incrased risk DS- declined further testing  Anatomy US normal  Prenatal Transfer Tool  Maternal Diabetes: No Genetic Screening: Abnormal:  Results: Elevated risk of Trisomy 21 Maternal Ultrasounds/Referrals: Normal Fetal Ultrasounds or other Referrals:  None Maternal Substance Abuse:  No Significant Maternal Medications:  Meds include: Zantac Significant Maternal Lab Results: Lab values include: Group B Strep negative  Results for orders placed or performed during the hospital encounter of 10/01/15 (from the past 24 hour(s))  [facility]   Collection Time: 10/01/15 10:14 PM  Result Value Ref Range   POCT Fern Test Positive = ruptured amniotic membanes     Patient Active Problem List   Diagnosis Date Noted  . Active labor 10/01/2015  . Abnormal maternal serum screening test  05/15/2015  . Placenta previa antepartum in second trimester 04/22/2015  . Supervision of normal pregnancy in first trimester 03/29/2015    Assessment: Annette Whitehead is a 35 y.o. G3P2002 at 7056w4d here for SROM.   #Labor: bishop score 5-6. Will start low dose Pitocin  #Pain: Desires Epidural  #FWB: Cat 1: HR 145-150, 15x15 accels, mod variability  #ID: GBS negative  #MOF: breast #MOC: IUD #Circ: n/a  Palma HolterKanishka G Gunadasa, MD

## 2015-10-01 NOTE — MAU Note (Signed)
Pt arrived with c/o SROM at 2130. Reports fetal movement and no pain.

## 2015-10-02 ENCOUNTER — Inpatient Hospital Stay (HOSPITAL_COMMUNITY): Payer: Medicaid Other | Admitting: Anesthesiology

## 2015-10-02 ENCOUNTER — Encounter (HOSPITAL_COMMUNITY): Payer: Self-pay | Admitting: Anesthesiology

## 2015-10-02 DIAGNOSIS — Z3A38 38 weeks gestation of pregnancy: Secondary | ICD-10-CM

## 2015-10-02 LAB — RPR: RPR: NONREACTIVE

## 2015-10-02 LAB — ABO/RH: ABO/RH(D): A POS

## 2015-10-02 MED ORDER — PRENATAL MULTIVITAMIN CH
1.0000 | ORAL_TABLET | Freq: Every day | ORAL | Status: DC
  Administered 2015-10-03: 1 via ORAL
  Filled 2015-10-02: qty 1

## 2015-10-02 MED ORDER — SIMETHICONE 80 MG PO CHEW: 80.0000 mg | CHEWABLE_TABLET | ORAL | Status: DC | PRN

## 2015-10-02 MED ORDER — ZOLPIDEM TARTRATE 5 MG PO TABS: 5.0000 mg | ORAL_TABLET | Freq: Every evening | ORAL | Status: DC | PRN

## 2015-10-02 MED ORDER — COCONUT OIL OIL
1.0000 "application " | TOPICAL_OIL | Status: DC | PRN
  Administered 2015-10-03: 1 via TOPICAL
  Filled 2015-10-02: qty 120

## 2015-10-02 MED ORDER — WITCH HAZEL-GLYCERIN EX PADS: 1.0000 "application " | MEDICATED_PAD | CUTANEOUS | Status: DC | PRN

## 2015-10-02 MED ORDER — TERBUTALINE SULFATE 1 MG/ML IJ SOLN
0.2500 mg | Freq: Once | INTRAMUSCULAR | Status: DC | PRN
  Filled 2015-10-02: qty 1

## 2015-10-02 MED ORDER — IBUPROFEN 600 MG PO TABS
600.0000 mg | ORAL_TABLET | Freq: Four times a day (QID) | ORAL | Status: DC
  Administered 2015-10-02 – 2015-10-03 (×5): 600 mg via ORAL
  Filled 2015-10-02 (×5): qty 1

## 2015-10-02 MED ORDER — DIPHENHYDRAMINE HCL 25 MG PO CAPS: 25.0000 mg | ORAL_CAPSULE | Freq: Four times a day (QID) | ORAL | Status: DC | PRN

## 2015-10-02 MED ORDER — TETANUS-DIPHTH-ACELL PERTUSSIS 5-2.5-18.5 LF-MCG/0.5 IM SUSP
0.5000 mL | Freq: Once | INTRAMUSCULAR | Status: AC
  Administered 2015-10-03: 0.5 mL via INTRAMUSCULAR

## 2015-10-02 MED ORDER — PHENYLEPHRINE 40 MCG/ML (10ML) SYRINGE FOR IV PUSH (FOR BLOOD PRESSURE SUPPORT)
80.0000 ug | PREFILLED_SYRINGE | INTRAVENOUS | Status: DC | PRN
  Filled 2015-10-02: qty 5

## 2015-10-02 MED ORDER — EPHEDRINE 5 MG/ML INJ
10.0000 mg | INTRAVENOUS | Status: DC | PRN
  Filled 2015-10-02: qty 2

## 2015-10-02 MED ORDER — ONDANSETRON HCL 4 MG/2ML IJ SOLN: 4.0000 mg | INTRAMUSCULAR | Status: DC | PRN

## 2015-10-02 MED ORDER — BENZOCAINE-MENTHOL 20-0.5 % EX AERO: 1.0000 "application " | INHALATION_SPRAY | CUTANEOUS | Status: DC | PRN

## 2015-10-02 MED ORDER — OXYTOCIN 40 UNITS IN LACTATED RINGERS INFUSION - SIMPLE MED
1.0000 m[IU]/min | INTRAVENOUS | Status: DC
  Administered 2015-10-02: 1 m[IU]/min via INTRAVENOUS
  Filled 2015-10-02: qty 1000

## 2015-10-02 MED ORDER — LIDOCAINE HCL (PF) 1 % IJ SOLN
INTRAMUSCULAR | Status: DC | PRN
  Administered 2015-10-02 (×2): 6 mL

## 2015-10-02 MED ORDER — LACTATED RINGERS IV SOLN
500.0000 mL | Freq: Once | INTRAVENOUS | Status: AC
  Administered 2015-10-02: 500 mL via INTRAVENOUS

## 2015-10-02 MED ORDER — FENTANYL 2.5 MCG/ML BUPIVACAINE 1/10 % EPIDURAL INFUSION (WH - ANES)
14.0000 mL/h | INTRAMUSCULAR | Status: DC | PRN
Start: 2015-10-02 — End: 2015-10-02
  Administered 2015-10-02: 14 mL/h via EPIDURAL
  Filled 2015-10-02: qty 125

## 2015-10-02 MED ORDER — SENNOSIDES-DOCUSATE SODIUM 8.6-50 MG PO TABS
2.0000 | ORAL_TABLET | ORAL | Status: DC
  Administered 2015-10-03: 2 via ORAL
  Filled 2015-10-02: qty 2

## 2015-10-02 MED ORDER — ACETAMINOPHEN 325 MG PO TABS
650.0000 mg | ORAL_TABLET | ORAL | Status: DC | PRN
  Administered 2015-10-03: 650 mg via ORAL
  Filled 2015-10-02: qty 2

## 2015-10-02 MED ORDER — LACTATED RINGERS IV SOLN: 500.0000 mL | Freq: Once | INTRAVENOUS | Status: DC

## 2015-10-02 MED ORDER — DIPHENHYDRAMINE HCL 50 MG/ML IJ SOLN: 12.5000 mg | INTRAMUSCULAR | Status: DC | PRN

## 2015-10-02 MED ORDER — ONDANSETRON HCL 4 MG PO TABS: 4.0000 mg | ORAL_TABLET | ORAL | Status: DC | PRN

## 2015-10-02 MED ORDER — DIBUCAINE 1 % RE OINT: 1.0000 "application " | TOPICAL_OINTMENT | RECTAL | Status: DC | PRN

## 2015-10-02 MED ORDER — PHENYLEPHRINE 40 MCG/ML (10ML) SYRINGE FOR IV PUSH (FOR BLOOD PRESSURE SUPPORT)
80.0000 ug | PREFILLED_SYRINGE | INTRAVENOUS | Status: DC | PRN
  Filled 2015-10-02: qty 5
  Filled 2015-10-02: qty 10

## 2015-10-02 NOTE — Anesthesia Preprocedure Evaluation (Signed)
Anesthesia Evaluation  Patient identified by MRN, date of birth, ID band Patient awake    Reviewed: Allergy & Precautions, NPO status , Patient's Chart, lab work & pertinent test results  Airway Mallampati: II  TM Distance: >3 FB Neck ROM: Full    Dental no notable dental hx.    Pulmonary neg pulmonary ROS,    Pulmonary exam normal breath sounds clear to auscultation       Cardiovascular negative cardio ROS Normal cardiovascular exam Rhythm:Regular Rate:Normal     Neuro/Psych  Headaches, negative psych ROS   GI/Hepatic Neg liver ROS, GERD  ,  Endo/Other  negative endocrine ROS  Renal/GU negative Renal ROS  negative genitourinary   Musculoskeletal negative musculoskeletal ROS (+)   Abdominal   Peds negative pediatric ROS (+)  Hematology  (+) anemia ,   Anesthesia Other Findings   Reproductive/Obstetrics negative OB ROS                             Anesthesia Physical Anesthesia Plan  ASA: II  Anesthesia Plan: Epidural   Post-op Pain Management:    Induction: Intravenous  Airway Management Planned: Natural Airway  Additional Equipment:   Intra-op Plan:   Post-operative Plan:   Informed Consent: I have reviewed the patients History and Physical, chart, labs and discussed the procedure including the risks, benefits and alternatives for the proposed anesthesia with the patient or authorized representative who has indicated his/her understanding and acceptance.   Dental advisory given  Plan Discussed with: CRNA  Anesthesia Plan Comments: (Informed consent obtained prior to proceeding including risk of failure, 1% risk of PDPH, risk of minor discomfort and bruising.  Discussed rare but serious complications including epidural abscess, permanent nerve injury, epidural hematoma.  Discussed alternatives to epidural analgesia and patient desires to proceed.  Timeout performed  pre-procedure verifying patient name, procedure, and platelet count.  Patient tolerated procedure well. )        Anesthesia Quick Evaluation

## 2015-10-02 NOTE — Lactation Note (Signed)
This note was copied from a baby's chart. Lactation Consultation Note  Patient Name: Annette Whitehead Today's Date: 10/02/2015 Reason for consult: Initial assessment  Initial visit at 8 hours of life. Mom is a P3 who nursed her 1st 2 children for 6 months each. Mom says that this infant is nursing "great." Mom appears to have no questions at this time.  Mom made aware of O/P services, breastfeeding support groups, community resources, and our phone # for post-discharge questions.   Lurline HareRichey, [place] [facility] 10/02/2015, 4:27 PM

## 2015-10-02 NOTE — Anesthesia Procedure Notes (Signed)
Epidural Patient location during procedure: OB  Staffing Anesthesiologist: Pax Reasoner  Preanesthetic Checklist Completed: patient identified, site marked, surgical consent, pre-op evaluation, timeout performed, IV checked, risks and benefits discussed and monitors and equipment checked  Epidural Patient position: sitting Prep: DuraPrep Patient monitoring: blood pressure and heart rate Approach: midline Location: L4-L5 Injection technique: LOR saline  Needle:  Needle type: Tuohy  Needle gauge: 17 G Needle length: 9 cm Needle insertion depth: 6 cm Catheter type: closed end flexible Catheter size: 19 Gauge Catheter at skin depth: 13 cm Test dose: negative and Other  Assessment Events: blood not aspirated, injection not painful, no injection resistance, negative IV test and no paresthesia  Additional Notes Reason for block:procedure for pain   

## 2015-10-02 NOTE — Anesthesia Postprocedure Evaluation (Signed)
Anesthesia Post Note  Patient: Annette Whitehead  Procedure(s) Performed: * No procedures listed *  Patient location during evaluation: Mother Baby Anesthesia Type: Epidural Level of consciousness: awake and alert and oriented Pain management: satisfactory to patient Vital Signs Assessment: post-procedure vital signs reviewed and stable Respiratory status: spontaneous breathing and nonlabored ventilation Cardiovascular status: stable Postop Assessment: no headache, no backache, no signs of nausea or vomiting, adequate PO intake and patient able to bend at knees (patient up walking) Anesthetic complications: no     Last Vitals:  Filed Vitals:   10/02/15 1015 10/02/15 1120  BP: 110/56 107/56  Pulse: 73 75  Temp: 36.8 C 36.9 C  Resp: 20 20    Last Pain:  Filed Vitals:   10/02/15 1136  PainSc: 2    Pain Goal: Patients Stated Pain Goal: 3 (10/02/15 0730)               Madison HickmanGREGORY,[name]

## 2015-10-03 MED ORDER — IBUPROFEN 600 MG PO TABS: 600.0000 mg | ORAL_TABLET | Freq: Four times a day (QID) | ORAL | Status: DC | PRN

## 2015-10-03 NOTE — Lactation Note (Signed)
This note was copied from a baby's chart. Lactation Consultation Note: Mother states that her milk is not in yet. Observed fullness in mother's breast.  Mother was given a harmony hand pump with instructions. Mother observed good drops of colostrum. Mother states that infant is feeding well. She is also giving formula. Advised mother to post pump for 15 mins on each breast with hand pump and use her own milk to supplement with. Mother receptive to all teaching.  Informed mother that infants cluster feeding is normal. Advised mother to feed infant with feeding cues with at least 8-12 times in 24 hours. Mother is aware of available LC services and BFSG's . [facility]  [facility].   Patient Name: Annette Whitehead FAOZH'YToday's Date: 10/03/2015 Reason for consult: Follow-up assessment   Maternal Data    Feeding    LATCH Score/Interventions                      Lactation Tools Discussed/Used     Consult Status Consult Status: Complete    Michel BickersKendrick, Pocahontas Cohenour McCoy 10/03/2015, 12:13 PM

## 2015-10-03 NOTE — Discharge Summary (Signed)
OB Discharge Summary     Patient Name: [facility] DOB: 1980/05/31 MRN: [id number]  Date of admission: 10/01/2015 Delivering MD: Palma HolterGUNADASA, KANISHKA G   Date of discharge: 10/03/2015  Admitting diagnosis: 39 WKS, WATER BROKE Intrauterine pregnancy: 319w5d     Secondary diagnosis:  Active Problems:   Active labor  Additional problems: PROM     Discharge diagnosis: Term Pregnancy Delivered                                                                                                Post partum procedures:none  Augmentation: Pitocin  Complications: None  Hospital course:  Induction of Labor With Vaginal Delivery   35 y.o. yo G3P3003 at 3119w5d was admitted to the hospital 10/01/2015 for induction of labor.  Indication for induction: PROM on 7/2 @ 2130. Patient had an uncomplicated labor course as follows: Membrane Rupture Time/Date: 9:30 PM ,10/01/2015   Intrapartum Procedures: Episiotomy: None [1]                                         Lacerations:  None [1]  Patient had delivery of a Viable infant.  Information for the patient's newborn:  Annette Whitehead, Girl Shearon Stallsltagraza [[id number]][[id number]]      10/02/2015 @ 0756 Details of delivery can be found in separate delivery note.  Patient had a routine postpartum course. Patient is discharged home 10/03/2015.   Physical exam  Filed Vitals:   10/02/15 1450 10/02/15 1817 10/02/15 2213 10/03/15 0531  BP: 109/59 104/50 108/53 105/47  Pulse: 76 61 60 53  Temp: 98.2 F (36.8 C) 98.4 F (36.9 C) 98.6 F (37 C) 98 F (36.7 C)  TempSrc: Oral Oral Oral Oral  Resp:  18 18 18   Height:      Weight:      SpO2:       General: alert and cooperative Lochia: appropriate Uterine Fundus: firm Incision: N/A DVT Evaluation: No evidence of DVT seen on physical exam. Labs: Lab Results  Component Value Date   WBC 7.7 10/01/2015   HGB 11.3* 10/01/2015   HCT 34.1* 10/01/2015   MCV 77.9* 10/01/2015   PLT 150 10/01/2015   CMP Latest Ref Rng  09/26/2015  Glucose 65 - 99 mg/dL 086(V101(H)  BUN 6 - 20 mg/dL 7  Creatinine 7.840.44 - 6.961.00 mg/dL 2.950.46  Sodium 284135 - 132145 mmol/L 133(L)  Potassium 3.5 - 5.1 mmol/L 3.7  Chloride 101 - 111 mmol/L 104  CO2 22 - 32 mmol/L 22  Calcium 8.9 - 10.3 mg/dL 4.4(W8.6(L)  Total Protein 6.5 - 8.1 g/dL 6.2(L)  Total Bilirubin 0.3 - 1.2 mg/dL 0.6  Alkaline Phos 38 - 126 U/L 148(H)  AST 15 - 41 U/L 18  ALT 14 - 54 U/L 13(L)    Discharge instruction: per After Visit Summary and "Baby and Me Booklet".  After visit meds:    Medication List    STOP taking these medications        ranitidine 150 MG  tablet  Commonly known as:  ZANTAC      TAKE these medications        ibuprofen 600 MG tablet  Commonly known as:  ADVIL,MOTRIN  Take 1 tablet (600 mg total) by mouth every 6 (six) hours as needed.     VITAFOL FE+ 90-1-200 & 50 MG Cppk  Take 2 tablets by mouth daily.        Diet: routine diet  Activity: Advance as tolerated. Pelvic rest for 6 weeks.   Outpatient follow up:6 weeks Follow up Appt:Future Appointments Date Time Provider Department Center  10/04/2015 4:00 PM Roe Coombsachelle A Denney, CNM [facility]   Follow up Visit:No Follow-up on file.  Postpartum contraception: IUD Mirena  Newborn Data: Live born female  Birth Weight: 6 lb 10.9 oz (3031 g) APGAR: 8, 9  Baby Feeding: Bottle and Breast Disposition:home with mother   10/03/2015 Cam HaiSHAW, Tabias Swayze, CNM  8:59 AM

## 2015-10-03 NOTE — Discharge Instructions (Signed)

## 2015-10-04 ENCOUNTER — Encounter: Payer: Medicaid Other | Admitting: Certified Nurse Midwife

## 2015-10-06 ENCOUNTER — Encounter: Payer: Medicaid Other | Admitting: Certified Nurse Midwife

## 2015-10-31 ENCOUNTER — Other Ambulatory Visit: Payer: Self-pay | Admitting: Certified Nurse Midwife

## 2015-11-08 ENCOUNTER — Encounter: Payer: Self-pay | Admitting: Certified Nurse Midwife

## 2015-11-08 ENCOUNTER — Ambulatory Visit (INDEPENDENT_AMBULATORY_CARE_PROVIDER_SITE_OTHER): Payer: Medicaid Other | Admitting: Certified Nurse Midwife

## 2015-11-08 NOTE — Progress Notes (Signed)
Subjective:     Annette Whitehead is a 35 y.o. female who presents for a postpartum visit. She is 4 weeks postpartum following a spontaneous vaginal delivery. I have fully reviewed the prenatal and intrapartum course. The delivery was at 39 gestational weeks. Outcome: spontaneous vaginal delivery. Anesthesia: epidural. Postpartum course has been normal. Baby's course has been normal. Baby is feeding by both breast and bottle - Similac Advance. Bleeding no bleeding. Bowel function is normal. Bladder function is normal. Patient is not sexually active. Contraception method is abstinence. Postpartum depression screening: negative.  Tobacco, alcohol and substance abuse history reviewed.  Adult immunizations reviewed including TDAP, rubella and varicella.  The following portions of the patient's history were reviewed and updated as appropriate: allergies, current medications, past family history, past medical history, past social history, past surgical history and problem list.  Review of Systems Pertinent items noted in HPI and remainder of comprehensive ROS otherwise negative.   Objective:    BP 128/74   Pulse 64   Temp 98.2 F (36.8 C) (Oral)   Ht 4\' 11"  (1.499 m)   Wt 135 lb (61.2 kg)   BMI 27.27 kg/m   General:  alert, cooperative and no distress   Breasts:  inspection negative, no nipple discharge or bleeding, no masses or nodularity palpable  Lungs: clear to auscultation bilaterally  Heart:  regular rate and rhythm, S1, S2 normal, no murmur, click, rub or gallop  Abdomen: soft, non-tender; bowel sounds normal; no masses,  no organomegaly          50% of 15 min visit spent on counseling and coordination of care.  Assessment:     Normal 4 week postpartum exam. Pap smear not done at today's visit.  Plan:    1. Contraception: abstinence 2.  Planning paraguard IUD, has used it in the past 3. Follow up in: 2 weeks for IUD & Pap smear or as needed.  2hr GTT for h/o GDM/screening  for DM q 3 yrs per ADA recommendations Preconception counseling provided Healthy lifestyle practices reviewed

## 2015-11-20 ENCOUNTER — Encounter (HOSPITAL_COMMUNITY): Payer: Self-pay | Admitting: Family Medicine

## 2015-11-20 ENCOUNTER — Ambulatory Visit (HOSPITAL_COMMUNITY)
Admission: EM | Admit: 2015-11-20 | Discharge: 2015-11-20 | Disposition: A | Discharge status: Home / Self Care | Payer: Medicaid Other

## 2015-11-20 DIAGNOSIS — T148 Other injury of unspecified body region: Secondary | ICD-10-CM | POA: Diagnosis not present

## 2015-11-20 DIAGNOSIS — S161XXA Strain of muscle, fascia and tendon at neck level, initial encounter: Secondary | ICD-10-CM | POA: Diagnosis not present

## 2015-11-20 DIAGNOSIS — T148XXA Other injury of unspecified body region, initial encounter: Secondary | ICD-10-CM
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
## 2015-11-20 NOTE — Discharge Instructions (Signed)
Heat and stretches as discussed. Motrin for pain.
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
## 2015-11-22 ENCOUNTER — Encounter: Payer: Self-pay | Admitting: *Deleted

## 2015-11-22 ENCOUNTER — Ambulatory Visit (INDEPENDENT_AMBULATORY_CARE_PROVIDER_SITE_OTHER): Payer: Medicaid Other | Admitting: Certified Nurse Midwife

## 2015-11-22 ENCOUNTER — Encounter: Payer: Self-pay | Admitting: Certified Nurse Midwife

## 2015-11-22 VITALS — BP 136/77 | HR 52 | Wt 135.0 lb

## 2015-11-22 DIAGNOSIS — Z30430 Encounter for insertion of intrauterine contraceptive device: Secondary | ICD-10-CM | POA: Diagnosis not present

## 2015-11-22 DIAGNOSIS — Z3202 Encounter for pregnancy test, result negative: Secondary | ICD-10-CM

## 2015-11-22 DIAGNOSIS — Z30014 Encounter for initial prescription of intrauterine contraceptive device: Secondary | ICD-10-CM

## 2015-11-22 DIAGNOSIS — Z01812 Encounter for preprocedural laboratory examination: Secondary | ICD-10-CM

## 2015-11-22 DIAGNOSIS — Z30431 Encounter for routine checking of intrauterine contraceptive device: Secondary | ICD-10-CM

## 2015-11-22 LAB — POCT URINE PREGNANCY: PREG TEST UR: NEGATIVE

## 2015-11-22 NOTE — Progress Notes (Signed)
IUD Procedure Note   DIAGNOSIS: Desires long-term, reversible contraception   PROCEDURE: IUD placement Performing Provider: Orvilla Cornwallachelle Kanisha Duba CNM  Patient counseled prior to procedure. I explained risks and benefits of Paragard IUD, reviewed alternative forms of contraception. Patient stated understanding and consented to continue with procedure.   LMP: unknown, postpartum Pregnancy Test: Negative Lot #: [id number] Expiration Date: October 2023   IUD type: [   ] Mirena   [X]  Paraguard  [   ] Christean GriefSkyla   [   ]  Kyleena  PROCEDURE:  Timeout procedure was performed to ensure right patient and right site.  A bimanual exam was performed to determine the position of the uterus, retroverted. The speculum was placed. The vagina and cervix was sterilized in the usual manner and sterile technique was maintained throughout the course of the procedure. A single toothed tenaculum was applied to the posterior lip of the cervix and gentle traction applied. The depth of the uterus was sounded to 9 cm. The IUD was inserted to the appropriate depth and inserted with slight difficulty. Not sure of IUD position, PUS to confirm position. The string was cut to an estimated 4 cm length. Bleeding was minimal. The patient tolerated the procedure well.   Follow up: The patient tolerated the procedure well without complications.  Standard post-procedure care is explained and return precautions are given.  Orvilla Cornwallachelle Theodor Mustin CNM

## 2015-11-30 ENCOUNTER — Telehealth: Payer: Self-pay

## 2015-11-30 NOTE — Telephone Encounter (Signed)
Called pt. No voicemail- cancelled U/S appt for 9-5 due to pt insurance medicaid is terminated

## 2015-12-05 ENCOUNTER — Other Ambulatory Visit: Payer: Medicaid Other

## 2015-12-12 ENCOUNTER — Ambulatory Visit: Payer: Medicaid Other | Admitting: Certified Nurse Midwife

## 2015-12-28 ENCOUNTER — Ambulatory Visit (INDEPENDENT_AMBULATORY_CARE_PROVIDER_SITE_OTHER): Payer: Medicaid Other | Admitting: Certified Nurse Midwife

## 2015-12-28 ENCOUNTER — Encounter: Payer: Self-pay | Admitting: Certified Nurse Midwife

## 2015-12-28 VITALS — BP 119/81 | HR 73 | Wt 140.0 lb

## 2015-12-28 DIAGNOSIS — Z3202 Encounter for pregnancy test, result negative: Secondary | ICD-10-CM | POA: Diagnosis not present

## 2015-12-28 DIAGNOSIS — T839XXS Unspecified complication of genitourinary prosthetic device, implant and graft, sequela: Secondary | ICD-10-CM

## 2015-12-28 DIAGNOSIS — Z30431 Encounter for routine checking of intrauterine contraceptive device: Secondary | ICD-10-CM

## 2015-12-28 LAB — POCT URINE PREGNANCY: PREG TEST UR: NEGATIVE

## 2015-12-28 NOTE — Progress Notes (Signed)
Patient ID: Annette Whitehead, female   DOB: 02-Dec-1980, 35 y.o.   MRN: [id number]  Chief Complaint  Patient presents with  . Follow-up    iud check up- pt cannot feel strings, has not had u/s    HPI Annette Whitehead is a 35 y.o. female.  IUD check up.  Cannot feel strings is worried about pregnancy.  Here for exam with spouse.  Desires [place] to be sure of contraception.    HPI  Past Medical History:  Diagnosis Date  . Anemia   . GERD (gastroesophageal reflux disease)   . Headache   . Medical history non-contributory     Past Surgical History:  Procedure Laterality Date  . NO PAST SURGERIES      Family History  Problem Relation Age of Onset  . Diabetes Mother   . Hypertension Mother   . Hyperlipidemia Mother   . Osteoporosis Mother   . Diabetes Father     Social History Social History  Substance Use Topics  . Smoking status: Never Smoker  . Smokeless tobacco: Never Used  . Alcohol use No    No Known Allergies  Current Outpatient Prescriptions  Medication Sig Dispense Refill  . ibuprofen (ADVIL,MOTRIN) 600 MG tablet Take 1 tablet (600 mg total) by mouth every 6 (six) hours as needed. (Patient not taking: Reported on 12/28/2015) 30 tablet 0  . Prenat-FePoly-Metf-FA-DHA-DSS (VITAFOL FE+) 90-1-200 & 50 MG CPPK Take 2 tablets by mouth daily. (Patient not taking: Reported on 12/28/2015) 60 each 12  . ranitidine (ZANTAC) 150 MG tablet TAKE 1 TABLET BY MOUTH TWICE A DAY (Patient not taking: Reported on 12/28/2015) 60 tablet 0   No current facility-administered medications for this visit.     Review of Systems Review of Systems Constitutional: negative for fatigue and weight loss Respiratory: negative for cough and wheezing Cardiovascular: negative for chest pain, fatigue and palpitations Gastrointestinal: negative for abdominal pain and change in bowel habits Genitourinary:negative Integument/breast: negative for nipple discharge Musculoskeletal:negative  for myalgias Neurological: negative for gait problems and tremors Behavioral/Psych: negative for abusive relationship, depression Endocrine: negative for temperature intolerance     Blood pressure 119/81, pulse 73, weight 140 lb (63.5 kg), last menstrual period 11/14/2015, unknown if currently breastfeeding.  Physical Exam Physical Exam General:   alert  Skin:   no rash or abnormalities  Lungs:   clear to auscultation bilaterally  Heart:   regular rate and rhythm, S1, S2 normal, no murmur, click, rub or gallop  Breasts:   normal without suspicious masses, skin or nipple changes or axillary nodes  Abdomen:  normal findings: no organomegaly, soft, non-tender and no hernia  Pelvis:  External genitalia: normal general appearance Urinary system: urethral meatus normal and bladder without fullness, nontender Vaginal: normal without tenderness, induration or masses Cervix: normal appearance, IUD strings present  Adnexa: normal bimanual exam Uterus: anteverted and non-tender, normal size    50% of 15 min visit spent on counseling and coordination of care.   Data Reviewed Previous medical hx, meds, labs  Assessment     IUD check up    Plan    Orders Placed This Encounter  Procedures  . US Transvaginal Non-OB    Standing Status:   Future    Standing Expiration Date:   02/26/2017    Order Specific Question:   Reason for Exam (SYMPTOM  OR DIAGNOSIS REQUIRED)    Answer:   IUD placement    Order Specific Question:   Preferred imaging location?  Answer:   Internal  . US Pelvis Complete    Standing Status:   Future    Standing Expiration Date:   02/26/2017    Order Specific Question:   Reason for Exam (SYMPTOM  OR DIAGNOSIS REQUIRED)    Answer:   IUD placement    Order Specific Question:   Preferred imaging location?    Answer:   Internal  . POCT urine pregnancy   No orders of the defined types were placed in this encounter.    Follow up as needed and with annual exam after  10/8/1.

## 2016-01-05 ENCOUNTER — Other Ambulatory Visit: Payer: Self-pay | Admitting: Certified Nurse Midwife

## 2016-01-09 ENCOUNTER — Other Ambulatory Visit: Payer: Self-pay

## 2016-01-16 ENCOUNTER — Ambulatory Visit: Payer: Medicaid Other | Admitting: Certified Nurse Midwife

## 2016-01-18 ENCOUNTER — Other Ambulatory Visit: Payer: No Typology Code available for payment source

## 2016-01-18 ENCOUNTER — Ambulatory Visit: Payer: Self-pay | Admitting: Certified Nurse Midwife

## 2016-01-18 ENCOUNTER — Encounter: Payer: Self-pay | Admitting: Certified Nurse Midwife

## 2016-01-18 ENCOUNTER — Ambulatory Visit (INDEPENDENT_AMBULATORY_CARE_PROVIDER_SITE_OTHER): Payer: Medicaid Other

## 2016-01-18 DIAGNOSIS — Z30431 Encounter for routine checking of intrauterine contraceptive device: Secondary | ICD-10-CM | POA: Diagnosis not present

## 2016-01-18 DIAGNOSIS — T8332XA Displacement of intrauterine contraceptive device, initial encounter: Secondary | ICD-10-CM

## 2016-01-18 DIAGNOSIS — T839XXS Unspecified complication of genitourinary prosthetic device, implant and graft, sequela: Secondary | ICD-10-CM

## 2016-01-23 ENCOUNTER — Other Ambulatory Visit: Payer: Self-pay | Admitting: Certified Nurse Midwife

## 2016-02-27 ENCOUNTER — Other Ambulatory Visit (HOSPITAL_COMMUNITY)
Admission: RE | Admit: 2016-02-27 | Discharge: 2016-02-27 | Disposition: A | Discharge status: Home / Self Care | Payer: Medicaid Other | Source: Ambulatory Visit | Attending: Certified Nurse Midwife | Admitting: Certified Nurse Midwife

## 2016-02-27 ENCOUNTER — Ambulatory Visit (INDEPENDENT_AMBULATORY_CARE_PROVIDER_SITE_OTHER): Payer: Medicaid Other | Admitting: Certified Nurse Midwife

## 2016-02-27 ENCOUNTER — Encounter: Payer: Self-pay | Admitting: Certified Nurse Midwife

## 2016-02-27 VITALS — BP 131/77 | HR 77 | Ht 61.0 in | Wt 136.0 lb

## 2016-02-27 DIAGNOSIS — Z01419 Encounter for gynecological examination (general) (routine) without abnormal findings: Secondary | ICD-10-CM

## 2016-02-27 DIAGNOSIS — Z30431 Encounter for routine checking of intrauterine contraceptive device: Secondary | ICD-10-CM | POA: Diagnosis not present

## 2016-02-27 DIAGNOSIS — N6320 Unspecified lump in the left breast, unspecified quadrant: Secondary | ICD-10-CM | POA: Diagnosis not present

## 2016-02-27 DIAGNOSIS — Z1151 Encounter for screening for human papillomavirus (HPV): Secondary | ICD-10-CM | POA: Diagnosis present

## 2016-02-27 NOTE — Progress Notes (Signed)
Subjective:      Annette Whitehead is a 35 y.o. female here for a routine exam.  Current complaints: none.  Delivered in July 2017, NSVD.  Had IUD placed on Aug 23rd, 2017: ParaGard.  Undergoing lots of stress right now: 34 year old daughter with fractured leg, mother with fractured hip.    Personal health questionnaire:  Is patient Ashkenazi Jewish, have a family history of breast and/or ovarian cancer: no Is there a family history of uterine cancer diagnosed at age < 2750, gastrointestinal cancer, urinary tract cancer, family member who is a Personnel officerLynch syndrome-associated carrier: no Is the patient overweight and hypertensive, family history of diabetes, personal history of gestational diabetes, preeclampsia or PCOS: yes Is patient over 5855, have PCOS,  family history of premature CHD under age 34, diabetes, smoke, have hypertension or peripheral artery disease:  yes At any time, has a partner hit, kicked or otherwise hurt or frightened you?: no Over the past 2 weeks, have you felt down, depressed or hopeless?: no Over the past 2 weeks, have you felt little interest or pleasure in doing things?:no   Gynecologic History No LMP recorded. Patient is not currently having periods (Reason: IUD). Contraception: IUD Last Pap: 01/06/15. Results were: normal Last mammogram: 10/12/13. Results were: abnormal: dense breast tissue, probable fibroadenoma: needs f/u  Obstetric History OB History  Gravida Para Term Preterm AB Living  3 3 3  0 0 3  SAB TAB Ectopic Multiple Live Births  0 0 0 0 3    # Outcome Date GA Lbr Len/2nd Weight Sex Delivery Anes PTL Lv  3 Term 10/02/15 6633w5d 03:30 / 01:26 6 lb 10.9 oz (3.031 kg) F Vag-Spont EPI  LIV     Birth Comments: facial bruising  2 Term 12/04/04 1618w0d  7 lb (3.175 kg) F Vag-Spont None N LIV  1 Term 03/26/00 3218w0d  7 lb (3.175 kg) M Vag-Spont EPI N LIV      Past Medical History:  Diagnosis Date  . Anemia   . GERD (gastroesophageal reflux disease)    . Headache   . Medical history non-contributory     Past Surgical History:  Procedure Laterality Date  . NO PAST SURGERIES       Current Outpatient Prescriptions:  .  ibuprofen (ADVIL,MOTRIN) 600 MG tablet, Take 1 tablet (600 mg total) by mouth every 6 (six) hours as needed. (Patient not taking: Reported on 02/27/2016), Disp: 30 tablet, Rfl: 0 .  Prenat-FePoly-Metf-FA-DHA-DSS (VITAFOL FE+) 90-1-200 & 50 MG CPPK, Take 2 tablets by mouth daily. (Patient not taking: Reported on 02/27/2016), Disp: 60 each, Rfl: 12 .  ranitidine (ZANTAC) 150 MG tablet, TAKE 1 TABLET BY MOUTH TWICE A DAY (Patient not taking: Reported on 02/27/2016), Disp: 60 tablet, Rfl: 0 No Known Allergies  Social History  Substance Use Topics  . Smoking status: Never Smoker  . Smokeless tobacco: Never Used  . Alcohol use No    Family History  Problem Relation Age of Onset  . Diabetes Mother   . Hypertension Mother   . Hyperlipidemia Mother   . Osteoporosis Mother   . Diabetes Father       Review of Systems  Constitutional: negative for fatigue and weight loss Respiratory: negative for cough and wheezing Cardiovascular: negative for chest pain, fatigue and palpitations Gastrointestinal: negative for abdominal pain and change in bowel habits Musculoskeletal:negative for myalgias Neurological: negative for gait problems and tremors Behavioral/Psych: negative for abusive relationship, depression Endocrine: negative for temperature intolerance  Genitourinary:negative for abnormal menstrual periods, genital lesions, hot flashes, sexual problems and vaginal discharge Integument/breast: + for breast lump, negative for breast tenderness, nipple discharge and skin lesion(s)    Objective:       BP 131/77   Pulse 77   Ht 5\' 1"  (1.549 m)   Wt 136 lb (61.7 kg)   BMI 25.70 kg/m  General:   alert  Skin:   no rash or abnormalities  Lungs:   clear to auscultation bilaterally  Heart:   regular rate and rhythm,  S1, S2 normal, no murmur, click, rub or gallop  Breasts:   right: normal without suspicious masses, skin or nipple changes or axillary nodes.  Left: palpable mobile roughly 1 cm mass around the 2 o'clock region  Abdomen:  normal findings: no organomegaly, soft, non-tender and no hernia  Pelvis:  External genitalia: normal general appearance Urinary system: urethral meatus normal and bladder without fullness, nontender Vaginal: normal without tenderness, induration or masses Cervix: normal appearance, IUD strings in cervical os Adnexa: normal bimanual exam Uterus: anteverted and non-tender, normal size   Lab Review Urine pregnancy test Labs reviewed yes Radiologic studies reviewed yes  50% of 30 min visit spent on counseling and coordination of care.    Assessment:    Healthy female exam.   Left breast Mass   Plan:    Education reviewed: calcium supplements, depression evaluation, low fat, low cholesterol diet, safe sex/STD prevention, self breast exams, skin cancer screening and weight bearing exercise. Contraception: IUD. Follow up in: 1 year.  Patient to schedule f/u US & mammogram  No orders of the defined types were placed in this encounter.  No orders of the defined types were placed in this encounter.

## 2016-02-29 LAB — CYTOLOGY - PAP
DIAGNOSIS: NEGATIVE
HPV (WINDOPATH): NOT DETECTED

## 2016-03-01 LAB — NUSWAB VG, CANDIDA 6SP
CANDIDA LUSITANIAE, NAA: NEGATIVE
CANDIDA PARAPSILOSIS, NAA: NEGATIVE
CANDIDA TROPICALIS, NAA: NEGATIVE
Candida albicans, NAA: NEGATIVE
Candida glabrata, NAA: NEGATIVE
Candida krusei, NAA: NEGATIVE
Trich vag by NAA: NEGATIVE

## 2016-07-11 ENCOUNTER — Encounter (HOSPITAL_COMMUNITY): Payer: Self-pay | Admitting: Emergency Medicine

## 2016-07-11 ENCOUNTER — Ambulatory Visit (HOSPITAL_COMMUNITY)
Admission: EM | Admit: 2016-07-11 | Discharge: 2016-07-11 | Disposition: A | Discharge status: Home / Self Care | Payer: Medicaid Other | Attending: Family Medicine | Admitting: Family Medicine

## 2016-07-11 DIAGNOSIS — J069 Acute upper respiratory infection, unspecified: Secondary | ICD-10-CM

## 2016-07-11 DIAGNOSIS — H00014 Hordeolum externum left upper eyelid: Secondary | ICD-10-CM | POA: Diagnosis not present

## 2016-07-11 DIAGNOSIS — B9789 Other viral agents as the cause of diseases classified elsewhere: Secondary | ICD-10-CM

## 2016-07-11 DIAGNOSIS — R05 Cough: Secondary | ICD-10-CM | POA: Diagnosis not present

## 2016-07-11 MED ORDER — DOXYCYCLINE HYCLATE 100 MG PO TABS: 100.0000 mg | ORAL_TABLET | Freq: Two times a day (BID) | ORAL | 0 refills | Status: DC

## 2016-07-11 NOTE — ED Provider Notes (Signed)
[facility]    CSN: [id number] Arrival date & time: 07/11/16  1054     History   Chief Complaint Chief Complaint  Patient presents with  . URI    HPI Annette Whitehead is a 36 y.o. female.   Pt reports nasal congestion, cough, chills, cold sores and a stye on her left eye since Saturday.  She's had no fever.  Patient denies hemoptysis or shortness of breath.      Past Medical History:  Diagnosis Date  . Anemia   . GERD (gastroesophageal reflux disease)   . Headache   . Medical history non-contributory     Patient Active Problem List   Diagnosis Date Noted  . Left breast mass 02/27/2016  . IUD check up 02/27/2016    Past Surgical History:  Procedure Laterality Date  . NO PAST SURGERIES      OB History    Gravida Para Term Preterm AB Living   0 0 3   SAB TAB Ectopic Multiple Live Births   0 0 0 0 3       Home Medications    Prior to Admission medications   Medication Sig Start Date End Date Taking? Authorizing Provider  doxycycline (VIBRA-TABS) 100 MG tablet Take 1 tablet (100 mg total) by mouth 2 (two) times daily. 07/11/16   Elvina Sidle, MD    Family History Family History  Problem Relation Age of Onset  . Diabetes Mother   . Hypertension Mother   . Hyperlipidemia Mother   . Osteoporosis Mother   . Diabetes Father     Social History Social History  Substance Use Topics  . Smoking status: Never Smoker  . Smokeless tobacco: Never Used  . Alcohol use No     Allergies   Patient has no known allergies.   Review of Systems Review of Systems  Constitutional: Negative.   HENT: Positive for congestion.   Eyes: Positive for pain.  Respiratory: Positive for cough.   Cardiovascular: Negative.   Gastrointestinal: Negative.   Genitourinary: Negative.   Neurological: Negative.      Physical Exam Triage Vital Signs ED Triage Vitals [07/11/16 1105]  Enc Vitals Group     BP 117/85     Pulse Rate 74   Resp 12     Temp 97.7 F (36.5 C)     Temp Source Oral     SpO2 98 %     Weight      Height      Head Circumference      Peak Flow      Pain Score 3     Pain Loc      Pain Edu?      Excl. in GC?    No data found.   Updated Vital Signs BP 117/85 (BP Location: Left Arm)   Pulse 74   Temp 97.7 F (36.5 C) (Oral)   Resp 12   SpO2 98%    Physical Exam  Constitutional: She is oriented to person, place, and time. She appears well-developed and well-nourished.  HENT:  Right Ear: External ear normal.  Left Ear: External ear normal.  Mouth/Throat: Oropharynx is clear and moist.  Eyes: Conjunctivae and EOM are normal. Pupils are equal, round, and reactive to light.  Small stye on the outer aspect of the left upper lid  Neck: Normal range of motion. Neck supple.  Cardiovascular: Normal rate, regular rhythm and normal heart sounds.   Pulmonary/Chest: Effort normal  and breath sounds normal.  Musculoskeletal: Normal range of motion.  Neurological: She is alert and oriented to person, place, and time.  Skin: Skin is warm and dry.  Nursing note and vitals reviewed.    UC Treatments / Results  Labs (all labs ordered are listed, but only abnormal results are displayed) Labs Reviewed - No data to display  EKG  EKG Interpretation None       Radiology No results found.  Procedures Procedures (including critical care time)  Medications Ordered in UC Medications - No data to display   Initial Impression / Assessment and Plan / UC Course  I have reviewed the triage vital signs and the nursing notes.  Pertinent labs & imaging results that were available during my care of the patient were reviewed by me and considered in my medical decision making (see chart for details).     Final Clinical Impressions(s) / UC Diagnoses   Final diagnoses:  Hordeolum externum of left upper eyelid  Viral URI with cough    New Prescriptions New Prescriptions   DOXYCYCLINE  (VIBRA-TABS) 100 MG TABLET    Take 1 tablet (100 mg total) by mouth 2 (two) times daily.     Elvina Sidle, MD 07/11/16 1136

## 2016-07-11 NOTE — ED Triage Notes (Signed)
Pt reports nasal congestion, cough, chills, cold sores and a stye on her left eye since Saturday.

## 2016-08-05 ENCOUNTER — Encounter (HOSPITAL_COMMUNITY): Payer: Self-pay | Admitting: Emergency Medicine

## 2016-08-05 ENCOUNTER — Ambulatory Visit (HOSPITAL_COMMUNITY)
Admission: EM | Admit: 2016-08-05 | Discharge: 2016-08-05 | Disposition: A | Discharge status: Home / Self Care | Payer: Medicaid Other | Attending: Internal Medicine | Admitting: Internal Medicine

## 2016-08-05 DIAGNOSIS — Z3202 Encounter for pregnancy test, result negative: Secondary | ICD-10-CM

## 2016-08-05 DIAGNOSIS — N390 Urinary tract infection, site not specified: Secondary | ICD-10-CM

## 2016-08-05 LAB — POCT URINALYSIS DIP (DEVICE)
BILIRUBIN URINE: NEGATIVE
GLUCOSE, UA: NEGATIVE mg/dL
KETONES UR: NEGATIVE mg/dL
NITRITE: NEGATIVE
Protein, ur: NEGATIVE mg/dL
Specific Gravity, Urine: 1.025 (ref 1.005–1.030)
Urobilinogen, UA: 0.2 mg/dL (ref 0.0–1.0)
pH: 6 (ref 5.0–8.0)

## 2016-08-05 LAB — POCT PREGNANCY, URINE: PREG TEST UR: NEGATIVE

## 2016-08-05 MED ORDER — CEPHALEXIN 500 MG PO CAPS: 500.0000 mg | ORAL_CAPSULE | Freq: Four times a day (QID) | ORAL | 0 refills | Status: DC

## 2016-08-05 NOTE — ED Provider Notes (Signed)
CSN: [id number]     Arrival date & time 08/05/16  1136 History   None    Chief Complaint  Patient presents with  . Urinary Tract Infection   (Consider location/radiation/quality/duration/timing/severity/associated sxs/prior Treatment) The history is provided by the patient. No language interpreter was used.  Urinary Tract Infection  Pain quality:  Aching Pain severity:  Mild Onset quality:  Gradual Timing:  Constant Progression:  Worsening Chronicity:  New Recent urinary tract infections: no   Relieved by:  Nothing Worsened by:  Nothing Ineffective treatments:  None tried Risk factors: no single kidney   Pt complains of burning with urination  Past Medical History:  Diagnosis Date  . Anemia   . GERD (gastroesophageal reflux disease)   . Headache   . Medical history non-contributory    Past Surgical History:  Procedure Laterality Date  . NO PAST SURGERIES     Family History  Problem Relation Age of Onset  . Diabetes Mother   . Hypertension Mother   . Hyperlipidemia Mother   . Osteoporosis Mother   . Diabetes Father    Social History  Substance Use Topics  . Smoking status: Never Smoker  . Smokeless tobacco: Never Used  . Alcohol use No   OB History    Gravida Para Term Preterm AB Living   3 3 3  0 0 3   SAB TAB Ectopic Multiple Live Births   0 0 0 0 3     Review of Systems  All other systems reviewed and are negative.   Allergies  Patient has no known allergies.  Home Medications   Prior to Admission medications   Medication Sig Start Date End Date Taking? Authorizing Provider  cephALEXin (KEFLEX) 500 MG capsule Take 1 capsule (500 mg total) by mouth 4 (four) times daily. 08/05/16   Elson AreasSofia, Langston [place] K, PA-C  doxycycline (VIBRA-TABS) 100 MG tablet Take 1 tablet (100 mg total) by mouth 2 (two) times daily. 07/11/16   Elvina SidleLauenstein, Kurt, MD   Meds Ordered and Administered this Visit  Medications - No data to display  BP (!) 111/58 (BP Location: Right Arm)    Pulse 63   Temp 98.5 F (36.9 C) (Oral)   Resp 20   LMP 07/15/2016   SpO2 98%  No data found.   Physical Exam  Constitutional: She is oriented to person, place, and time. She appears well-developed and well-nourished.  HENT:  Head: Normocephalic.  Eyes: EOM are normal.  Neck: Normal range of motion.  Pulmonary/Chest: Effort normal.  Abdominal: She exhibits no distension.  Musculoskeletal: Normal range of motion.  Neurological: She is alert and oriented to person, place, and time.  Psychiatric: She has a normal mood and affect.  Nursing note and vitals reviewed.   Urgent Care Course     Procedures (including critical care time)  Labs Review Labs Reviewed  POCT URINALYSIS DIP (DEVICE) - Abnormal; Notable for the following:       Result Value   Hgb urine dipstick TRACE (*)    Leukocytes, UA MODERATE (*)    All other components within normal limits  POCT PREGNANCY, URINE    Imaging Review No results found.   Visual Acuity Review  Right Eye Distance:   Left Eye Distance:   Bilateral Distance:    Right Eye Near:   Left Eye Near:    Bilateral Near:         MDM   1. Urinary tract infection without hematuria, site unspecified  Meds ordered this encounter  Medications  . cephALEXin (KEFLEX) 500 MG capsule    Sig: Take 1 capsule (500 mg total) by mouth 4 (four) times daily.    Dispense:  28 capsule    Refill:  0    Order Specific Question:   Supervising Provider    Answer:   Eustace Moore [[id number]]  An After Visit Summary was printed and given to the patient.   Elson Areas, [place] 08/05/16 1436

## 2016-08-05 NOTE — ED Triage Notes (Signed)
Here for UTI sx onset 1 week ++ associated w/urinary frequency and urgency, abd pressure, hematuria  Denies dysuria, fevers, chills  Taking ampicillin she got [facility] w/temp relief.   A&O x4... NAD

## 2016-08-05 NOTE — Discharge Instructions (Signed)
Return if any problems.

## 2016-08-07 IMAGING — US US MFM OB FOLLOW-UP
1 series · 14 of 28 positions shown · non-contrast
Comparison: none

[Series 1: us mfm ob follow-up · 14 of 40 slices shown]
[im 2/40]
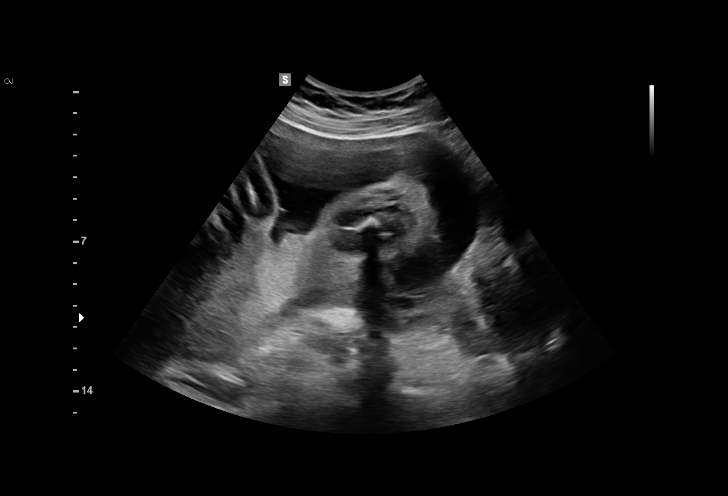
[im 5/40]
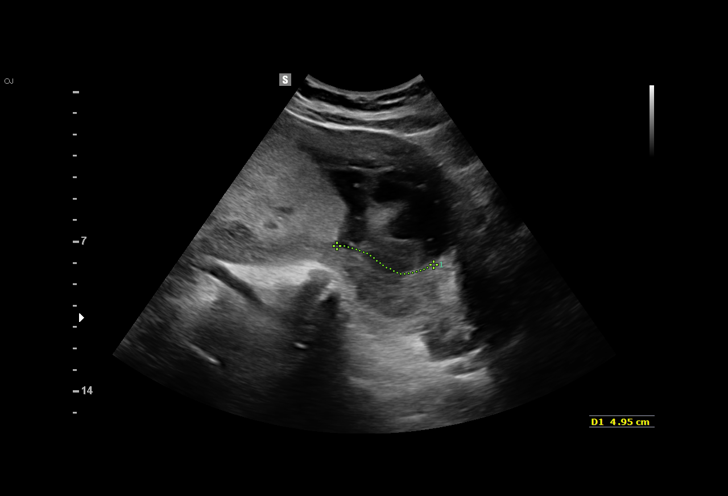
[im 8/40]
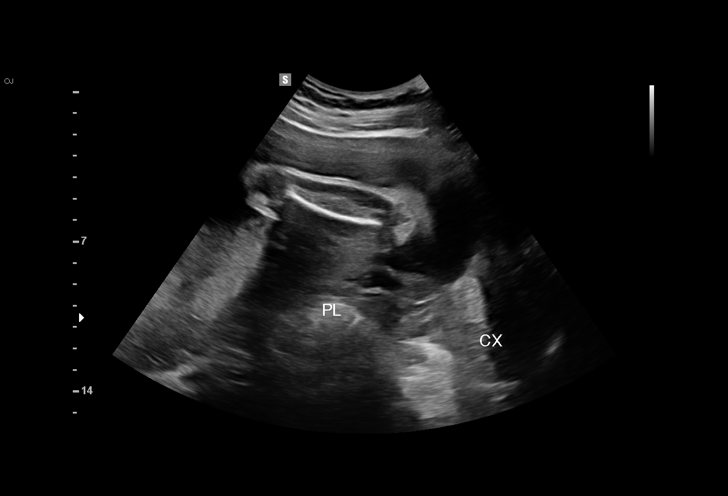
[im 11/40]
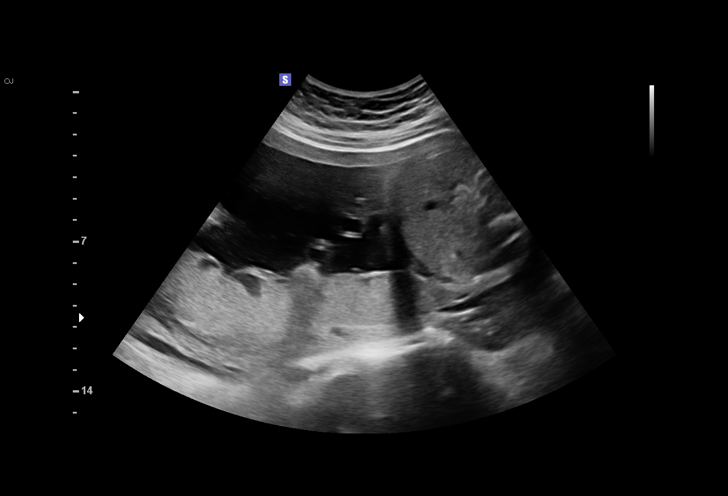
[im 14/40]
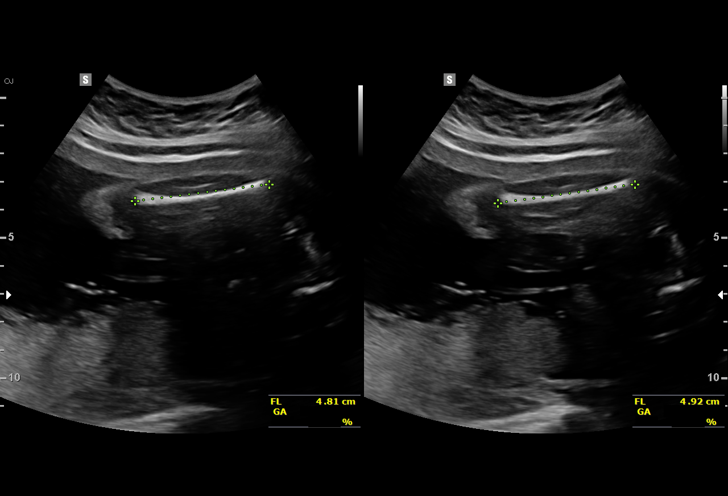
[im 16/40]
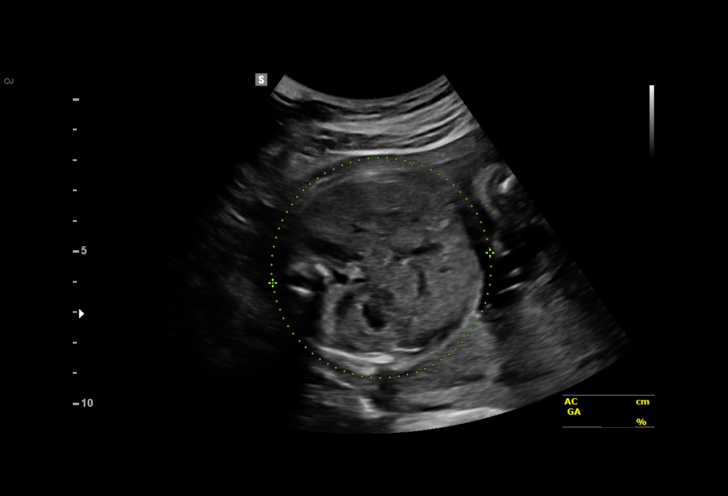
[im 19/40]
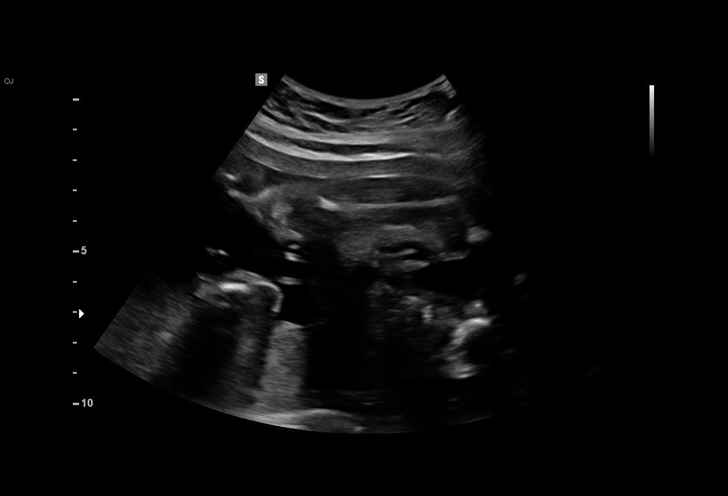
[im 22/40]
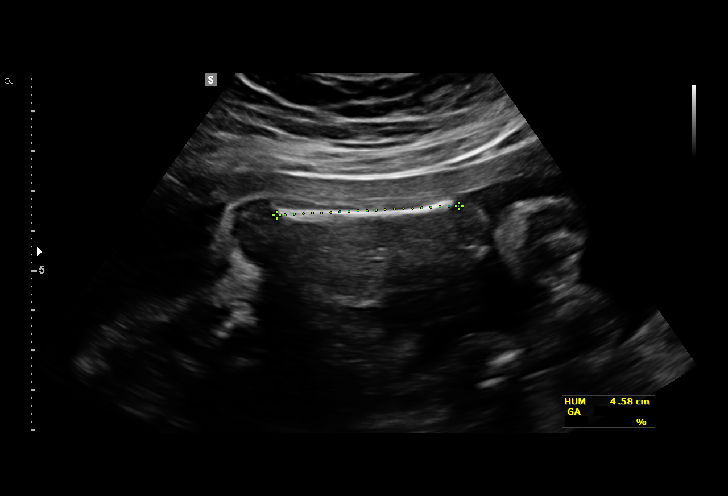
[im 25/40]
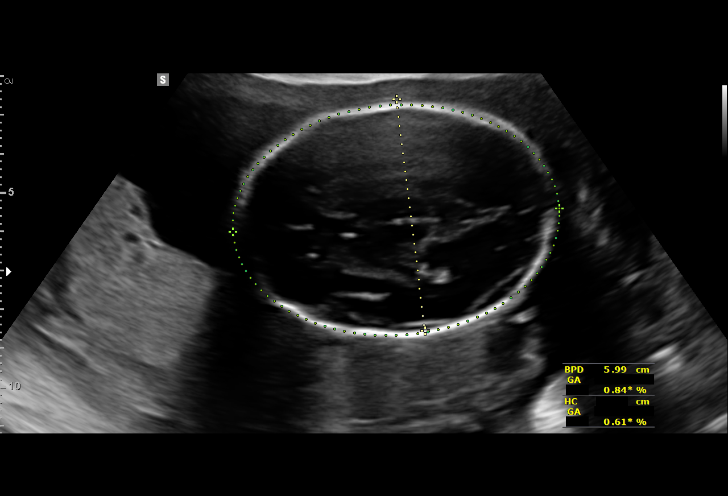
[im 28/40]
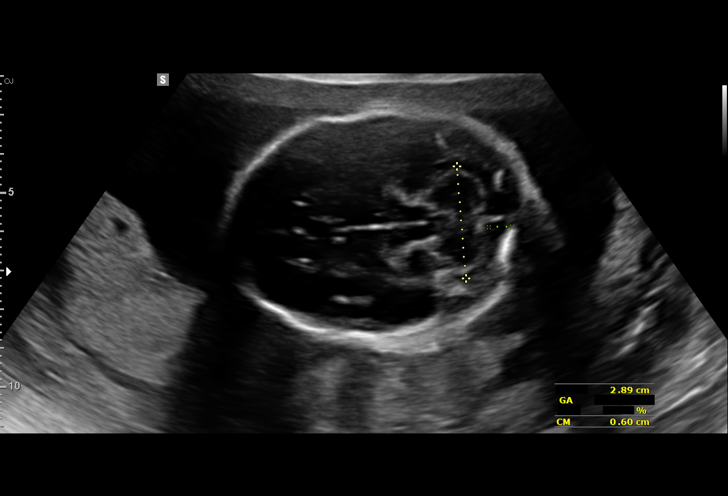
[im 31/40]
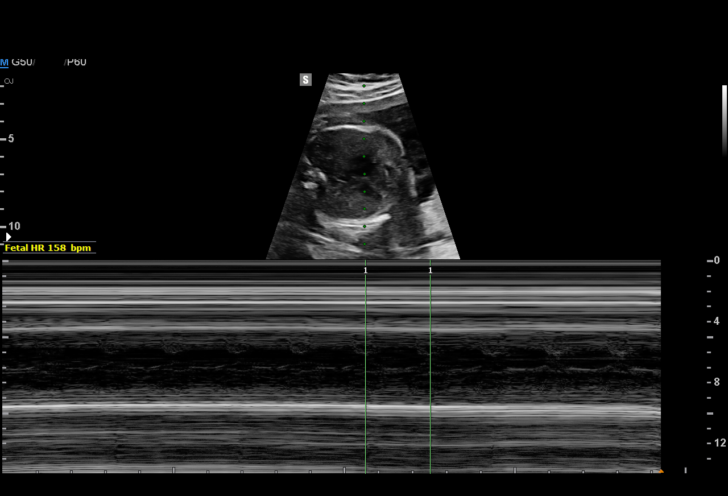
[im 34/40]
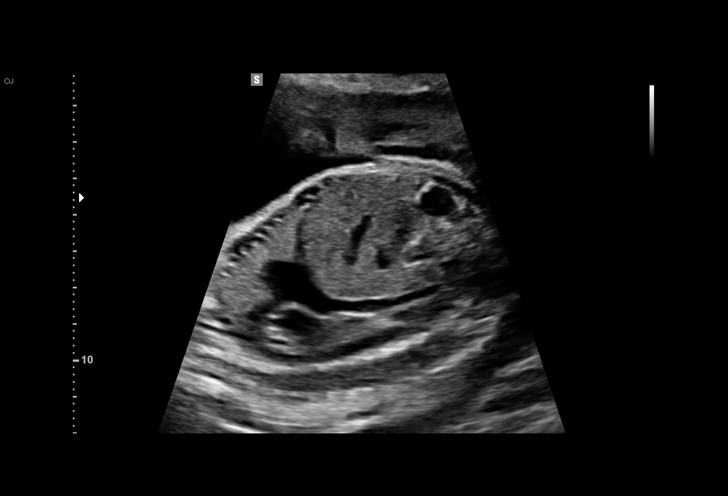
[im 37/40]
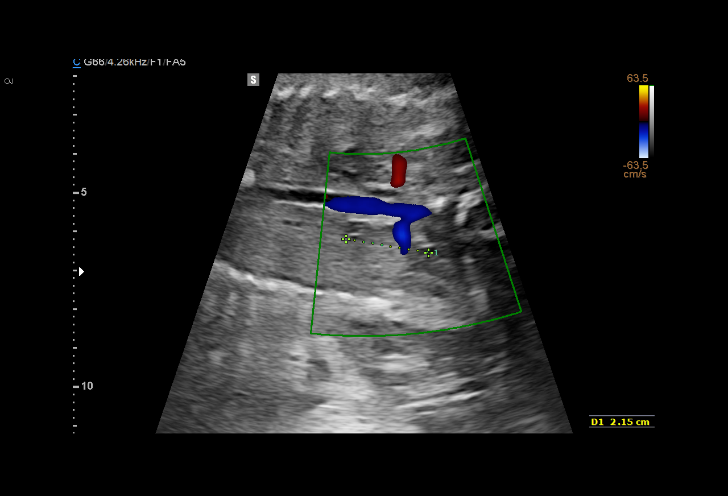
[im 40/40]
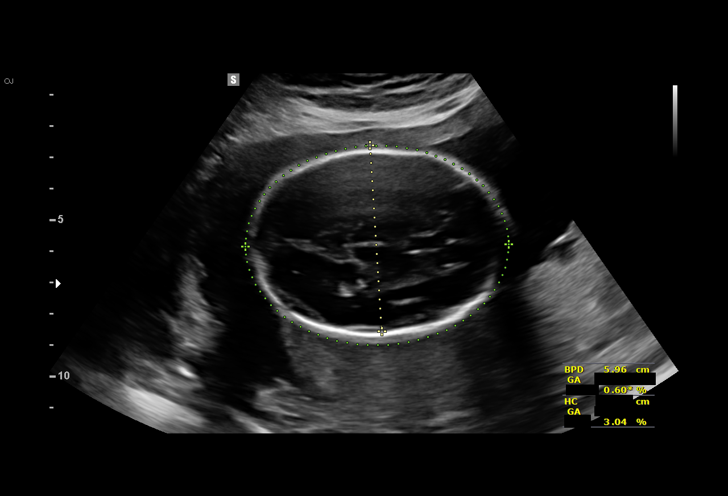

[14 of 28 positions shown; findings below may reference images not displayed]

[REDACTED] [HOSPITAL]

1  [facility]            [id number]      [id number]     [id number]
Indications

26 weeks gestation of pregnancy
Abnormal biochemical screen (quad) for
Trisomy 21 ([DATE]); declined testing
Low lying placenta, antepartum
OB History

Gravidity:    3         Term:   2        Prem:   0        SAB:   0
TOP:          0       Ectopic:  0        Living: 2
Fetal Evaluation

Num Of Fetuses:     1
Fetal Heart         158
Rate(bpm):
Cardiac Activity:   Observed
Presentation:       Breech
Placenta:           Posterior, above cervical os
P. Cord Insertion:  Previously Visualized

Amniotic Fluid
AFI FV:      Subjectively within normal limits
Larg Pckt:     3.8  cm
Biometry

BPD:      59.6  mm     G. Age:  24w 2d                  CI:        68.89   %    70 - 86
FL/HC:      21.1   %    18.6 -
HC:      229.4  mm     G. Age:  25w 0d        < 3  %    HC/AC:      1.00        1.05 -
AC:      228.4  mm     G. Age:  27w 2d         57  %    FL/BPD:     81.4   %    71 - 87
FL:       48.5  mm     G. Age:  26w 2d         23  %    FL/AC:      21.2   %    20 - 24
HUM:      46.9  mm     G. Age:  27w 4d         67  %
CER:      28.9  mm     G. Age:  25w 6d         31  %
CM:          6  mm

Est. FW:     942  gm      2 lb 1 oz     49  %
Gestational Age

LMP:           26w 5d        Date:  01/04/15                 EDD:   10/11/15
U/S Today:     25w 5d                                        EDD:   10/18/15
Best:          26w 5d     Det. By:  LMP  (01/04/15)          EDD:   10/11/15
Anatomy

Cranium:          Appears normal         Aortic Arch:      Previously seen
Fetal Cavum:      Appears normal         Ductal Arch:      Previously seen
Ventricles:       Appears normal         Diaphragm:        Appears normal
Choroid Plexus:   Previously seen        Stomach:          Appears normal, left
sided
Cerebellum:       Appears normal         Abdomen:          Appears normal
Posterior Fossa:  Appears normal         Abdominal Wall:   Previously seen
Nuchal Fold:      Previously seen        Cord Vessels:     Appears normal (3
vessel cord)
Face:             Orbits and profile     Kidneys:          Appear normal
previously seen
Lips:             Previously seen        Bladder:          Appears normal
Fetal Thoracic:   Appears normal         Spine:            Previously seen
Heart:            Appears normal         Upper             Previously seen
(4CH, axis, and        Extremities:
situs)
RVOT:             Previously seen        Lower             Previously seen
Extremities:
LVOT:             Previously seen

Other:  Fetus appears to be a female. Heels prev.  visualized. Rt 5th digit
prev visualized. Technically difficult due to fetal position.
Cervix Uterus Adnexa

Cervix
Length:            3.7  cm.
Normal appearance by transabdominal scan.
Impression

SIUP at 26+5 weeks
Normal interval anatomy; anatomic survey complete
Normal amniotic fluid volume
Appropriate interval growth with EFW at the 49th %tile
Posterior placenta; no previa
Recommendations

Follow-up as clinically indicated

## 2016-10-14 ENCOUNTER — Encounter (HOSPITAL_COMMUNITY): Payer: Self-pay | Admitting: *Deleted

## 2016-10-25 IMAGING — US US ABDOMEN LIMITED
1 series · 15 of 25 positions shown · non-contrast
Comparison: None.

CLINICAL DATA: Abdominal pain within the right upper quadrant

EXAM:
US ABDOMEN LIMITED - RIGHT UPPER QUADRANT

[Series 1: us abdomen limited · 15 of 41 slices shown]
[im 1/41]
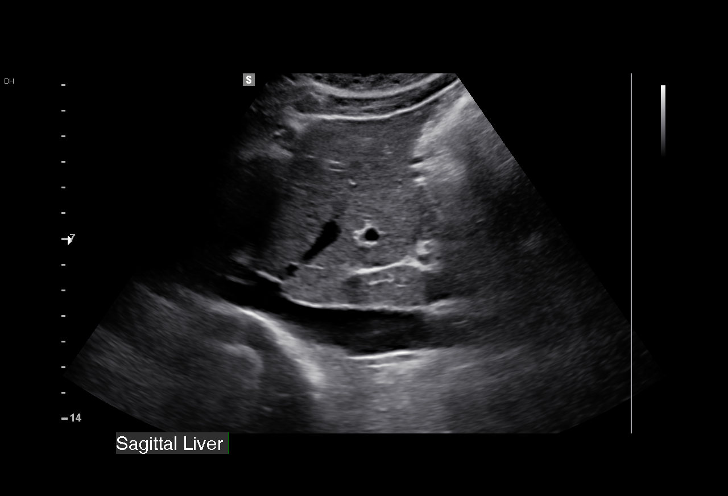
[im 4/41]
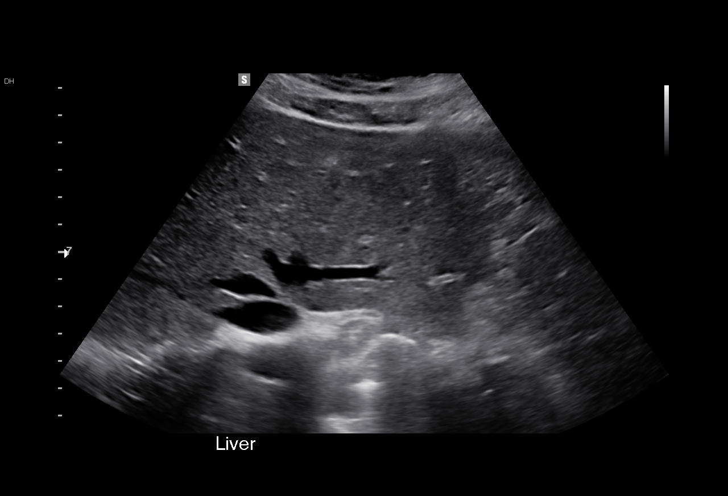
[im 7/41]
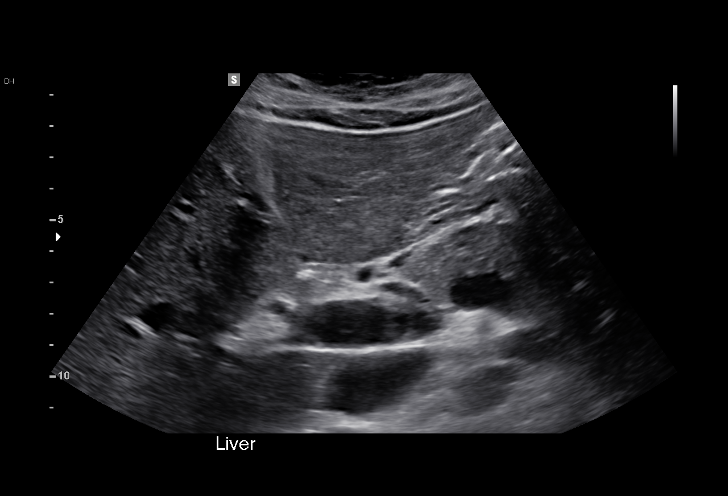
[im 9/41]
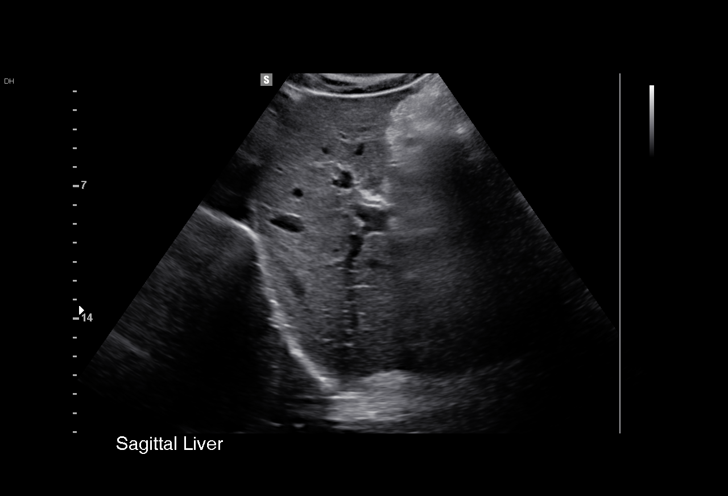
[im 12/41]
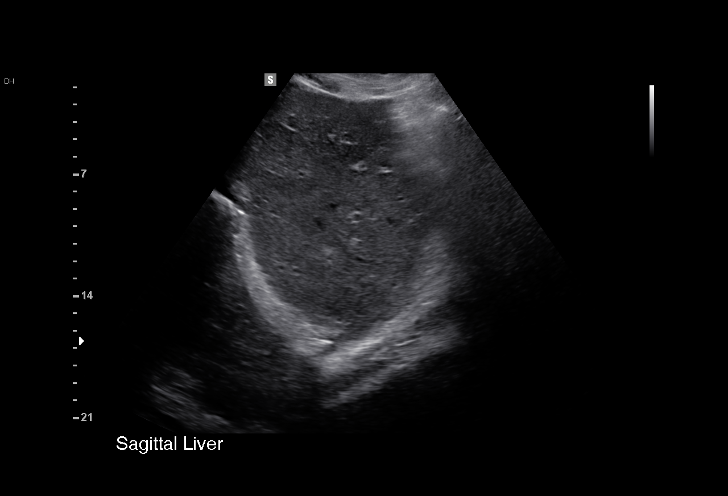
[im 16/41]
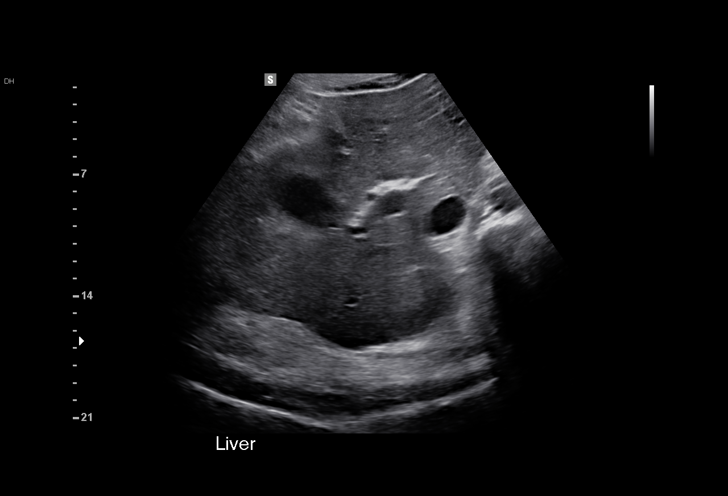
[im 17/41]
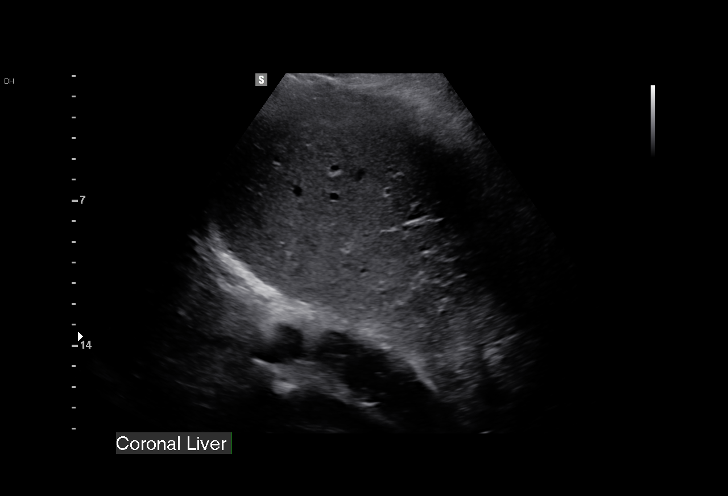
[im 21/41]
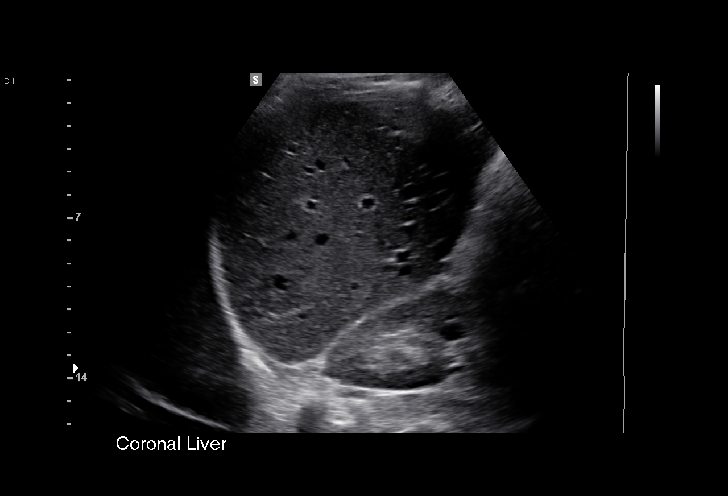
[im 24/41]
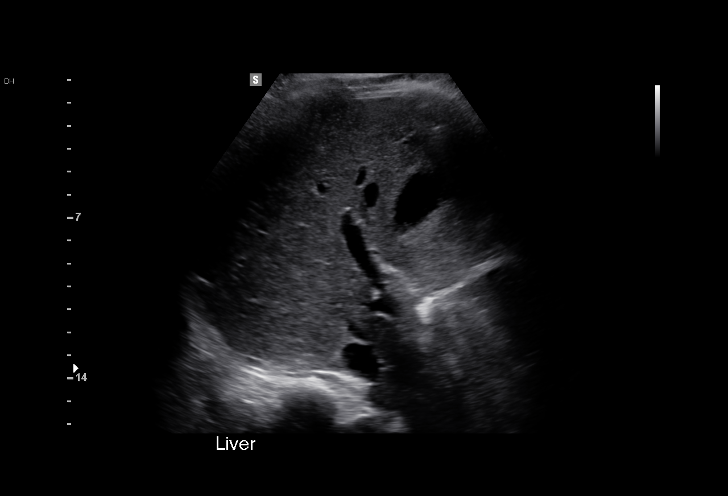
[im 26/41]
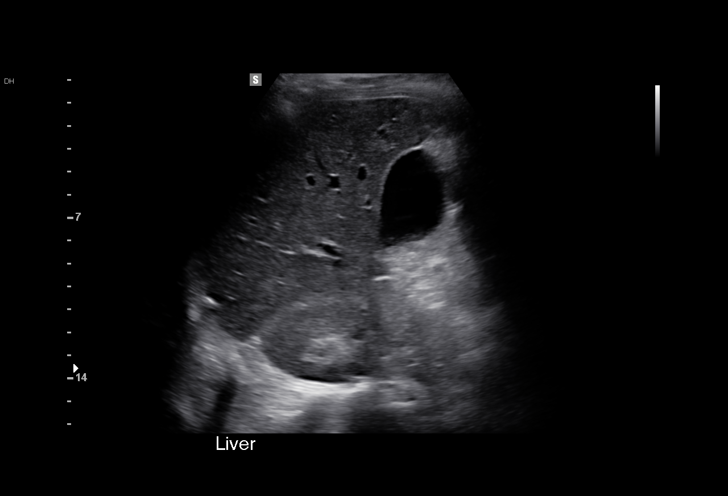
[im 29/41]
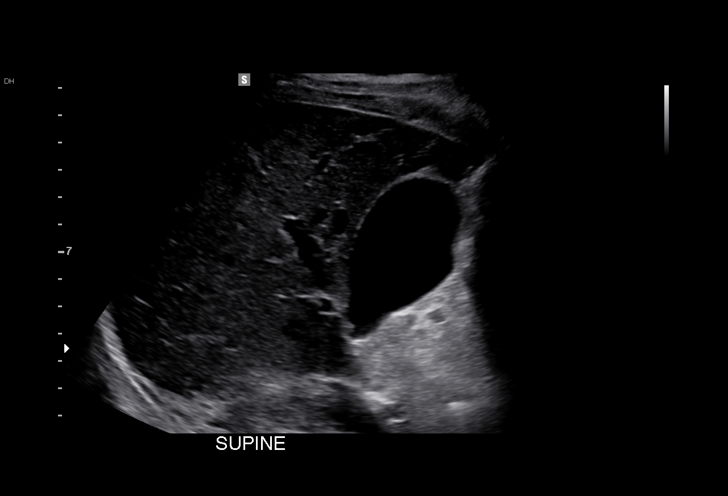
[im 32/41]
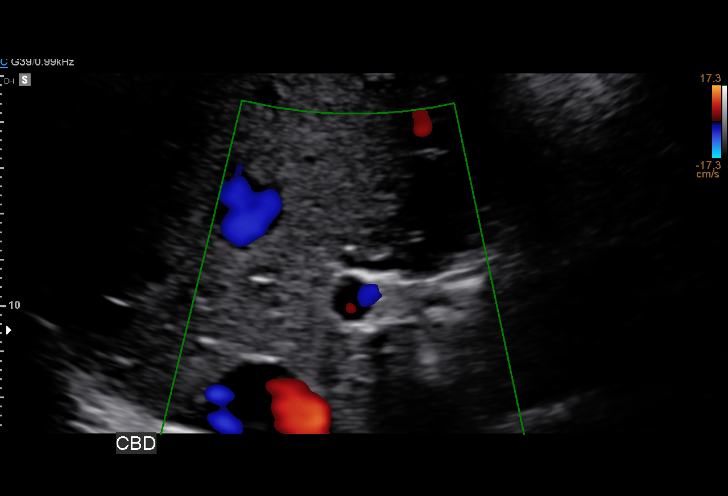
[im 34/41]
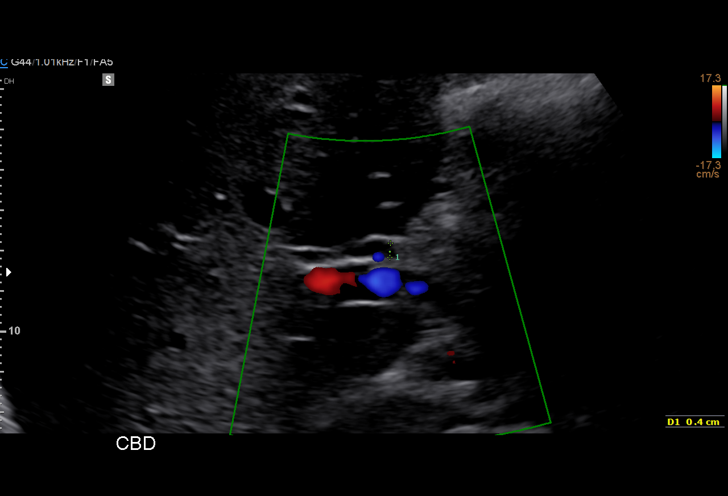
[im 37/41]
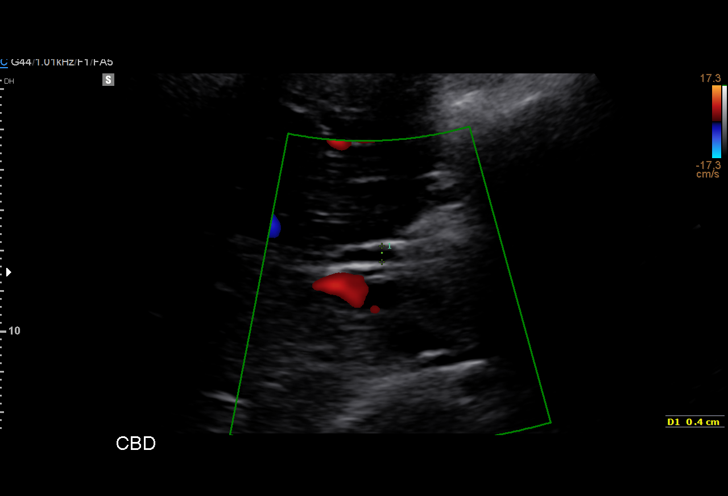
[im 41/41]
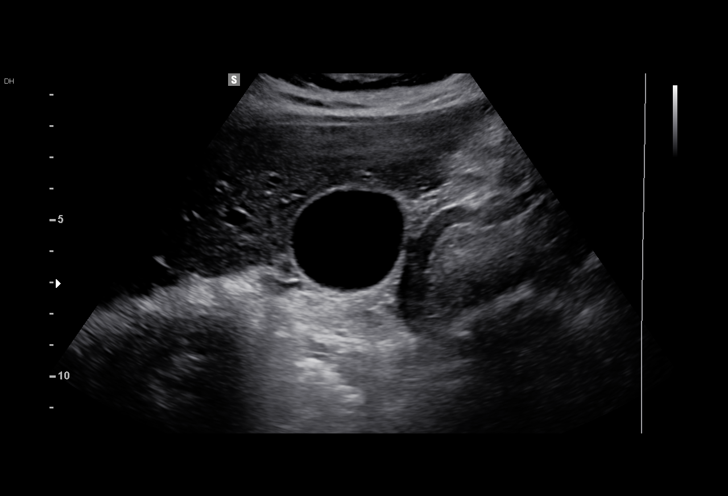

[15 of 25 positions shown; findings below may reference images not displayed]

FINDINGS: Gallbladder:

No gallstones or wall thickening visualized. No sonographic Murphy
sign noted by sonographer.

Common bile duct:

Diameter:

Liver:

No focal lesion identified. Within normal limits in parenchymal
echogenicity.
IMPRESSION: Normal right upper quadrant sonogram.

## 2016-12-20 ENCOUNTER — Encounter (HOSPITAL_COMMUNITY): Payer: Self-pay | Admitting: Emergency Medicine

## 2016-12-20 ENCOUNTER — Ambulatory Visit (HOSPITAL_COMMUNITY)
Admission: EM | Admit: 2016-12-20 | Discharge: 2016-12-20 | Disposition: A | Discharge status: Home / Self Care | Payer: Medicaid Other | Attending: Urgent Care | Admitting: Urgent Care

## 2016-12-20 DIAGNOSIS — R1013 Epigastric pain: Secondary | ICD-10-CM

## 2016-12-20 DIAGNOSIS — B9681 Helicobacter pylori [H. pylori] as the cause of diseases classified elsewhere: Secondary | ICD-10-CM

## 2016-12-20 DIAGNOSIS — R110 Nausea: Secondary | ICD-10-CM | POA: Diagnosis not present

## 2016-12-20 DIAGNOSIS — A048 Other specified bacterial intestinal infections: Secondary | ICD-10-CM

## 2016-12-20 LAB — POCT I-STAT, CHEM 8
BUN: 8 mg/dL (ref 6–20)
Calcium, Ion: 1.15 mmol/L (ref 1.15–1.40)
Chloride: 101 mmol/L (ref 101–111)
Creatinine, Ser: 0.6 mg/dL (ref 0.44–1.00)
Glucose, Bld: 103 mg/dL — ABNORMAL HIGH (ref 65–99)
HEMATOCRIT: 39 % (ref 36.0–46.0)
HEMOGLOBIN: 13.3 g/dL (ref 12.0–15.0)
POTASSIUM: 3.7 mmol/L (ref 3.5–5.1)
SODIUM: 139 mmol/L (ref 135–145)
TCO2: 28 mmol/L (ref 22–32)

## 2016-12-20 LAB — POCT H PYLORI SCREEN: H. PYLORI SCREEN, POC: POSITIVE — AB

## 2016-12-20 MED ORDER — CLARITHROMYCIN 500 MG PO TABS: 500.0000 mg | ORAL_TABLET | Freq: Two times a day (BID) | ORAL | 0 refills | Status: DC

## 2016-12-20 MED ORDER — AMOXICILLIN 500 MG PO CAPS: 1000.0000 mg | ORAL_CAPSULE | Freq: Two times a day (BID) | ORAL | 0 refills | Status: DC

## 2016-12-20 MED ORDER — OMEPRAZOLE 20 MG PO CPDR: 20.0000 mg | DELAYED_RELEASE_CAPSULE | Freq: Every day | ORAL | 0 refills | Status: DC

## 2016-12-20 NOTE — ED Provider Notes (Signed)
  MRN: [id number] DOB: 26-Nov-1980  Subjective:   Annette Whitehead is a 36 y.o. female presenting for chief complaint of Abdominal Pain  Reports 1 week history of intermittent, sharp, epigastric pain that radiates upwards and toward her upper back. Has associated nausea. Has decreased appetite. Denies fever, vomiting. Admits eating a significant amount of spicy foods, also drinks sodas. Has a history of GERD associated with her pregnancy. Denies history of h. pylori infection. Denies smoking cigarettes.  Lyrick is not currently taking any medications and has No Known Allergies.  Gerica  has a past medical history of Anemia; GERD (gastroesophageal reflux disease); Headache; and Medical history non-contributory. Denies past surgical history.  Objective:   Vitals: BP 110/62 (BP Location: Left Arm)   Pulse (!) 53   Temp 98.4 F (36.9 C) (Oral)   SpO2 100%   Physical Exam  Constitutional: She is oriented to person, place, and time. She appears well-developed and well-nourished.  Cardiovascular: Normal rate, regular rhythm and intact distal pulses.  Exam reveals no gallop and no friction rub.   No murmur heard. Pulmonary/Chest: No respiratory distress. She has no wheezes. She has no rales. She exhibits tenderness (between epigastric region and mid-sternum).  Abdominal: Soft. Bowel sounds are normal. She exhibits no distension and no mass. There is tenderness (epigastric). There is no guarding.  Neurological: She is alert and oriented to person, place, and time.  Skin: Skin is warm and dry.  Psychiatric: She has a normal mood and affect.   Results for orders placed or performed during the hospital encounter of 12/20/16 (from the past 24 hour(s))  I-STAT, chem 8     Status: Abnormal   Collection Time: 12/20/16  2:37 PM  Result Value Ref Range   Sodium 139 135 - 145 mmol/L   Potassium 3.7 3.5 - 5.1 mmol/L   Chloride 101 101 - 111 mmol/L   BUN 8 6 - 20 mg/dL   Creatinine, Ser  2.13 0.44 - 1.00 mg/dL   Glucose, Bld 086 (H) 65 - 99 mg/dL   Calcium, Ion 5.78 4.69 - 1.40 mmol/L   TCO2 28 22 - 32 mmol/L   Hemoglobin 13.3 12.0 - 15.0 g/dL   HCT 62.9 52.8 - 41.3 %  H.pylori screen, POC     Status: Abnormal   Collection Time: 12/20/16  2:57 PM  Result Value Ref Range   H. PYLORI SCREEN, POC POSITIVE (A) NEGATIVE     Assessment and Plan :   Helicobacter pylori infection  Abdominal pain, epigastric  Start triple therapy for h. Pylori eradication. Counseled patient on potential for adverse effects with medications prescribed today, patient verbalized understanding. Return-to-clinic precautions discussed, patient verbalized understanding.   Wallis Bamberg, PA-C [facility]  12/20/2016  1:43 PM    Wallis Bamberg, PA-C 12/20/16 1501

## 2016-12-20 NOTE — ED Triage Notes (Signed)
Pt reports upper abdominal pain x1 week.  She cannot describe the pain but states it hurts when she walks or takes a deep breath.

## 2017-01-27 ENCOUNTER — Other Ambulatory Visit: Payer: Self-pay | Admitting: Certified Nurse Midwife

## 2017-04-09 ENCOUNTER — Ambulatory Visit (HOSPITAL_COMMUNITY)
Admission: EM | Admit: 2017-04-09 | Discharge: 2017-04-09 | Disposition: A | Discharge status: Home / Self Care | Payer: Medicaid Other | Attending: Family Medicine | Admitting: Family Medicine

## 2017-04-09 ENCOUNTER — Encounter (HOSPITAL_COMMUNITY): Payer: Self-pay | Admitting: Emergency Medicine

## 2017-04-09 ENCOUNTER — Other Ambulatory Visit: Payer: Self-pay

## 2017-04-09 DIAGNOSIS — M545 Low back pain: Secondary | ICD-10-CM | POA: Diagnosis not present

## 2017-04-09 DIAGNOSIS — S39012A Strain of muscle, fascia and tendon of lower back, initial encounter: Secondary | ICD-10-CM | POA: Diagnosis not present

## 2017-04-09 MED ORDER — DICLOFENAC SODIUM 75 MG PO TBEC: 75.0000 mg | DELAYED_RELEASE_TABLET | Freq: Two times a day (BID) | ORAL | 0 refills | Status: AC

## 2017-04-09 MED ORDER — CYCLOBENZAPRINE HCL 10 MG PO TABS: 10.0000 mg | ORAL_TABLET | Freq: Every day | ORAL | 0 refills | Status: AC
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
## 2017-10-14 ENCOUNTER — Encounter (HOSPITAL_COMMUNITY): Payer: Self-pay | Admitting: Emergency Medicine

## 2017-10-14 ENCOUNTER — Ambulatory Visit (HOSPITAL_COMMUNITY)
Admission: EM | Admit: 2017-10-14 | Discharge: 2017-10-14 | Disposition: A | Discharge status: Home / Self Care | Payer: Medicaid Other | Attending: Family Medicine | Admitting: Family Medicine

## 2017-10-14 ENCOUNTER — Other Ambulatory Visit: Payer: Self-pay

## 2017-10-14 DIAGNOSIS — T63441A Toxic effect of venom of bees, accidental (unintentional), initial encounter: Secondary | ICD-10-CM | POA: Diagnosis not present

## 2017-10-14 DIAGNOSIS — L03113 Cellulitis of right upper limb: Secondary | ICD-10-CM

## 2017-10-14 MED ORDER — PREDNISONE 10 MG PO TABS: 20.0000 mg | ORAL_TABLET | Freq: Two times a day (BID) | ORAL | 0 refills | Status: AC

## 2017-10-14 MED ORDER — AMOXICILLIN-POT CLAVULANATE 875-125 MG PO TABS: 1.0000 | ORAL_TABLET | Freq: Two times a day (BID) | ORAL | 0 refills | Status: AC

## 2017-10-14 NOTE — Discharge Instructions (Addendum)
Wash site daily with warm water and mild soap Take antibiotic as prescribed and to completion.  Take prednisone as prescribed for itching and inflammation Primary care provider assistance initiated to establish care  Follow up here or with PCP if symptoms persists Return or go to the ED if you have any new or worsening symptoms such as increased redness, swelling, pain, fever, chills, nausea, vomiting, etc...Marland Kitchen

## 2017-10-14 NOTE — ED Provider Notes (Signed)
[facility]   [id number] 10/14/17 Arrival Time: 1145  SUBJECTIVE:  Annette Whitehead is a 37 y.o. female who presents with a skin complaint that began 4 days ago.  Symptoms began after a bee sting.  Localizes the rash to right forearm.  Describes it as painful, red, itchy, and swelling.  Has tried baking soda and water without relief.  Symptoms are made worse with itching.  Denies similar symptoms in the past. Denies fever, chills, nausea, vomiting, SOB, chest pain, abdominal pain, changes in bowel or bladder function.    ROS: As per HPI.  Past Medical History:  Diagnosis Date  . Anemia   . GERD (gastroesophageal reflux disease)   . Headache   . Medical history non-contributory    Past Surgical History:  Procedure Laterality Date  . NO PAST SURGERIES     No Known Allergies No current facility-administered medications on file prior to encounter.    No current outpatient medications on file prior to encounter.   Social History   Socioeconomic History  . Marital status: Married    Spouse name: Not on file  . Number of children: Not on file  . Years of education: Not on file  . Highest education level: Not on file  Occupational History  . Not on file  Social Needs  . Financial resource strain: Not on file  . Food insecurity:    Worry: Not on file    Inability: Not on file  . Transportation needs:    Medical: Not on file    Non-medical: Not on file  Tobacco Use  . Smoking status: Never Smoker  . Smokeless tobacco: Never Used  Substance and Sexual Activity  . Alcohol use: No  . Drug use: No  . Sexual activity: Yes    Birth control/protection: None, IUD  Lifestyle  . Physical activity:    Days per week: Not on file    Minutes per session: Not on file  . Stress: Not on file  Relationships  . Social connections:    Talks on phone: Not on file    Gets together: Not on file    Attends religious service: Not on file    Active member of club or  organization: Not on file    Attends meetings of clubs or organizations: Not on file    Relationship status: Not on file  . Intimate partner violence:    Fear of current or ex partner: Not on file    Emotionally abused: Not on file    Physically abused: Not on file    Forced sexual activity: Not on file  Other Topics Concern  . Not on file  Social History Narrative  . Not on file   Family History  Problem Relation Age of Onset  . Diabetes Mother   . Hypertension Mother   . Hyperlipidemia Mother   . Osteoporosis Mother   . Diabetes Father     OBJECTIVE: Vitals:   10/14/17 1226  BP: 126/64  Pulse: 69  Resp: 16  Temp: 98.2 F (36.8 C)  TempSrc: Oral  SpO2: 100%    General appearance: alert; no distress Lungs: clear to auscultation bilaterally Heart: regular rate and rhythm.  Radial pulse 2+ bilaterally Extremities: no edema Skin: warm and dry; papule with significant surrounding erythema and moderate swelling to the medial right forearm; no obvious drainage; blanches with pressure; mildly tender to palpation.   Psychological: alert and cooperative; normal mood and affect  ASSESSMENT & PLAN:  1. Bee sting, accidental or unintentional, initial encounter   2. Cellulitis of right upper extremity     Meds ordered this encounter  Medications  . amoxicillin-clavulanate (AUGMENTIN) 875-125 MG tablet    Sig: Take 1 tablet by mouth every 12 (twelve) hours for 10 days.    Dispense:  20 tablet    Refill:  0    Order Specific Question:   Supervising Provider    Answer:   Isa Rankin [phone]  . predniSONE (DELTASONE) 10 MG tablet    Sig: Take 2 tablets (20 mg total) by mouth 2 (two) times daily with a meal for 5 days.    Dispense:  20 tablet    Refill:  0    Order Specific Question:   Supervising Provider    Answer:   Isa Rankin [[id number]]   Wash site daily with warm water and mild soap Take antibiotic as prescribed and to completion.  Take  prednisone as prescribed for itching and inflammation Primary care provider assistance initiated to establish care  Follow up here or with PCP if symptoms persists Return or go to the ED if you have any new or worsening symptoms such as increased redness, swelling, pain, fever, chills, nausea, vomiting, etc...  Reviewed expectations re: course of current medical issues. Questions answered. Outlined signs and symptoms indicating need for more acute intervention. Patient verbalized understanding. After Visit Summary given.   Rennis Harding, PA-C 10/14/17 1308

## 2017-10-14 NOTE — ED Triage Notes (Signed)
[facility].  The patient complained of redness and swelling to her right forearm from a bee sting 4 days ago.  The patient also complained of a sore throat for "weeks."

## 2019-08-05 ENCOUNTER — Ambulatory Visit: Payer: Self-pay | Admitting: Nurse Practitioner

## 2022-07-01 DIAGNOSIS — Z419 Encounter for procedure for purposes other than remedying health state, unspecified: Secondary | ICD-10-CM | POA: Diagnosis not present

## 2022-07-31 DIAGNOSIS — Z419 Encounter for procedure for purposes other than remedying health state, unspecified: Secondary | ICD-10-CM | POA: Diagnosis not present

## 2022-08-31 DIAGNOSIS — Z419 Encounter for procedure for purposes other than remedying health state, unspecified: Secondary | ICD-10-CM | POA: Diagnosis not present

## 2022-09-03 ENCOUNTER — Encounter (HOSPITAL_COMMUNITY): Payer: Self-pay | Admitting: *Deleted

## 2022-09-03 ENCOUNTER — Ambulatory Visit (HOSPITAL_COMMUNITY)
Admission: EM | Admit: 2022-09-03 | Discharge: 2022-09-03 | Disposition: A | Discharge status: Home / Self Care | Payer: Medicaid Other | Attending: Urgent Care | Admitting: Urgent Care

## 2022-09-03 ENCOUNTER — Other Ambulatory Visit: Payer: Self-pay

## 2022-09-03 DIAGNOSIS — R5383 Other fatigue: Secondary | ICD-10-CM | POA: Diagnosis not present

## 2022-09-03 DIAGNOSIS — Z8349 Family history of other endocrine, nutritional and metabolic diseases: Secondary | ICD-10-CM

## 2022-09-03 DIAGNOSIS — R400 Somnolence: Secondary | ICD-10-CM | POA: Insufficient documentation

## 2022-09-03 LAB — CBC WITH DIFFERENTIAL/PLATELET
Abs Immature Granulocytes: 0.01 10*3/uL (ref 0.00–0.07)
Basophils Absolute: 0 10*3/uL (ref 0.0–0.1)
Basophils Relative: 1 %
Eosinophils Absolute: 0 10*3/uL (ref 0.0–0.5)
Eosinophils Relative: 1 %
HCT: 36.5 % (ref 36.0–46.0)
Hemoglobin: 11.3 g/dL — ABNORMAL LOW (ref 12.0–15.0)
Immature Granulocytes: 0 %
Lymphocytes Relative: 28 %
Lymphs Abs: 1.6 10*3/uL (ref 0.7–4.0)
MCH: 23.3 pg — ABNORMAL LOW (ref 26.0–34.0)
MCHC: 31 g/dL (ref 30.0–36.0)
MCV: 75.1 fL — ABNORMAL LOW (ref 80.0–100.0)
Monocytes Absolute: 0.5 10*3/uL (ref 0.1–1.0)
Monocytes Relative: 9 %
Neutro Abs: 3.4 10*3/uL (ref 1.7–7.7)
Neutrophils Relative %: 61 %
Platelets: 224 10*3/uL (ref 150–400)
RBC: 4.86 MIL/uL (ref 3.87–5.11)
RDW: 14.6 % (ref 11.5–15.5)
WBC: 5.5 10*3/uL (ref 4.0–10.5)
nRBC: 0 % (ref 0.0–0.2)

## 2022-09-03 LAB — COMPREHENSIVE METABOLIC PANEL
ALT: 22 U/L (ref 0–44)
AST: 24 U/L (ref 15–41)
Albumin: 3.8 g/dL (ref 3.5–5.0)
Alkaline Phosphatase: 66 U/L (ref 38–126)
Anion gap: 8 (ref 5–15)
BUN: 13 mg/dL (ref 6–20)
CO2: 25 mmol/L (ref 22–32)
Calcium: 8.6 mg/dL — ABNORMAL LOW (ref 8.9–10.3)
Chloride: 103 mmol/L (ref 98–111)
Creatinine, Ser: 0.69 mg/dL (ref 0.44–1.00)
GFR, Estimated: >60 mL/min (ref >60)
Glucose, Bld: 89 mg/dL (ref 70–99)
Potassium: 3.9 mmol/L (ref 3.5–5.1)
Sodium: 136 mmol/L (ref 135–145)
Total Bilirubin: 0.5 mg/dL (ref 0.3–1.2)
Total Protein: 6.9 g/dL (ref 6.5–8.1)

## 2022-09-03 LAB — TSH: TSH: 1.659 u[IU]/mL (ref 0.350–4.500)

## 2022-09-03 LAB — VITAMIN B12: Vitamin B-12: 346 pg/mL (ref 180–914)

## 2022-09-03 NOTE — ED Provider Notes (Signed)
[facility]    CSN: [id number] Arrival date & time: 09/03/22  1951      History   Chief Complaint Chief Complaint  Patient presents with   Fatigue    HPI Annette Whitehead is a 42 y.o. female.   42yo female presents today due to concerns of acute onset of fatigue within the past 1-2 weeks. She states that she used to work only 4 hours a day but recently started working full time.  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  Within the past few weeks she also got a new job at [employer] and works 4 PM to 10 PM.  She usually falls asleep around 11:30 PM, and gets up at 5:30 AM.  She denies any trouble falling asleep.  She wakes up feeling unrested.  She denies waking up numerous times at night.  She believes that she does snore but denies a known history of sleep apnea.  She states every day for the past week and a half she has nearly fallen asleep around 11 AM.  The other day she felt like she was unsafe to drive due to her degree of fatigue.  Patient is requesting blood work due to a significant family history of hypothyroidism. Pt denies any known PMH and takes no daily Rx medications.     Past Medical History:  Diagnosis Date   Anemia    GERD (gastroesophageal reflux disease)    Headache    Medical history non-contributory     Patient Active Problem List   Diagnosis Date Noted   Left breast mass 02/27/2016   IUD check up 02/27/2016    Past Surgical History:  Procedure Laterality Date   NO PAST SURGERIES      OB History     Gravida  3   Para  3   Term  3   Preterm  0   AB  0   Living  3      SAB  0   IAB  0   Ectopic  0   Multiple  0   Live Births  3            Home Medications    Prior to Admission medications   Not on File    Family History Family History  Problem Relation Age of Onset   Diabetes Mother    Hypertension Mother    Hyperlipidemia Mother    Osteoporosis Mother     Diabetes Father     Social History Social History   Tobacco Use   Smoking status: Never   Smokeless tobacco: Never  Substance Use Topics   Alcohol use: No   Drug use: No     Allergies   Patient has no known allergies.   Review of Systems Review of Systems As per HPI  Physical Exam Triage Vital Signs ED Triage Vitals  Enc Vitals Group     BP 09/03/22 2049 120/70     Pulse Rate 09/03/22 2049 (!) 59     Resp 09/03/22 2049 18     Temp 09/03/22 2049 98 F (36.7 C)     Temp src --      SpO2 09/03/22 2049 98 %     Weight --      Height --      Head Circumference --      Peak Flow --      Pain Score 09/03/22 2048 0  Pain Loc --      Pain Edu? --      Excl. in GC? --    No data found.  Updated Vital Signs BP 120/70   Pulse (!) 59   Temp 98 F (36.7 C)   Resp 18   LMP 08/03/2022 (Approximate)   SpO2 98%   Visual Acuity Right Eye Distance:   Left Eye Distance:   Bilateral Distance:    Right Eye Near:   Left Eye Near:    Bilateral Near:     Physical Exam Vitals and nursing note reviewed.  Constitutional:      General: She is not in acute distress.    Appearance: Normal appearance. She is well-developed and normal weight. She is not ill-appearing, toxic-appearing or diaphoretic.  HENT:     Head: Normocephalic and atraumatic.     Right Ear: Tympanic membrane, ear canal and external ear normal. There is no impacted cerumen.     Left Ear: Tympanic membrane, ear canal and external ear normal. There is no impacted cerumen.     Nose: Nose normal. No congestion or rhinorrhea.     Mouth/Throat:     Mouth: Mucous membranes are moist.     Pharynx: Oropharynx is clear. No oropharyngeal exudate or posterior oropharyngeal erythema.  Eyes:     General: No scleral icterus.       Right eye: No discharge.        Left eye: No discharge.     Extraocular Movements: Extraocular movements intact.     Conjunctiva/sclera: Conjunctivae normal.     Pupils: Pupils are  equal, round, and reactive to light.     Comments: NO conjunctival pallor  Cardiovascular:     Rate and Rhythm: Regular rhythm. Bradycardia present.     Pulses: Normal pulses.     Heart sounds: Normal heart sounds. No murmur heard.    No friction rub.  Pulmonary:     Effort: Pulmonary effort is normal. No respiratory distress.     Breath sounds: Normal breath sounds. No stridor. No wheezing, rhonchi or rales.  Chest:     Chest wall: No tenderness.  Abdominal:     Palpations: Abdomen is soft.     Tenderness: There is no abdominal tenderness.  Musculoskeletal:        General: No swelling.     Cervical back: Normal range of motion and neck supple. No rigidity or tenderness.  Lymphadenopathy:     Cervical: No cervical adenopathy.  Skin:    General: Skin is warm and dry.     Capillary Refill: Capillary refill takes less than 2 seconds.     Coloration: Skin is not jaundiced or pale.     Findings: No bruising, erythema or rash.  Neurological:     General: No focal deficit present.     Mental Status: She is alert and oriented to person, place, and time.     Sensory: No sensory deficit.     Motor: No weakness.  Psychiatric:        Mood and Affect: Mood normal.      UC Treatments / Results  Labs (all labs ordered are listed, but only abnormal results are displayed) Labs Reviewed  CBC WITH DIFFERENTIAL/PLATELET  COMPREHENSIVE METABOLIC PANEL  TSH  VITAMIN B12    EKG   Radiology No results found.  Procedures Procedures (including critical care time)  Medications Ordered in UC Medications - No data to display  Initial Impression / Assessment and Plan /  UC Course  I have reviewed the triage vital signs and the nursing notes.  Pertinent labs & imaging results that were available during my care of the patient were reviewed by me and considered in my medical decision making (see chart for details).     Fatigue -this is new within the past 2 weeks, around the same time  she got a second job.  Suspect this is primarily due to working many hours and not getting enough sleep.  Patient is extremely concerned about a secondary cause though and is requesting blood work today.  Will obtain CMP, CBC, thyroid, B12 Daytime somnolence -patient admits to a history of snoring.  She reports waking up unrefreshed, and falling asleep between 11 AM and noon on a daily basis.  Recommend she discuss this with her PCP and consider a sleep study/OSA workup. Family history of thyroid disorder -TSH obtained today.  Normal thyroid assessment on exam, no nodules or enlargement appreciated.   Final Clinical Impressions(s) / UC Diagnoses   Final diagnoses:  Other fatigue  Daytime somnolence  Family history of thyroid disorder     Discharge Instructions      Please try and get 8 hours of sleep daily. We obtained lab evaluation to assess for your causes of fatigue. I highly suspect this is secondary to working 2 jobs, and not getting enough sleep. If your labs are all normal, and symptoms persist, please discuss referral to sleep medicine to possibly obtain a sleep study.     ED Prescriptions   None    PDMP not reviewed this encounter.   Maretta Bees, [place] 09/03/22 2123

## 2022-09-03 NOTE — ED Triage Notes (Signed)
Pt reports having fatigue. 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

## 2022-09-03 NOTE — Discharge Instructions (Addendum)
Please try and get 8 hours of sleep daily. We obtained lab evaluation to assess for your causes of fatigue. I highly suspect this is secondary to working 2 jobs, and not getting enough sleep. If your labs are all normal, and symptoms persist, please discuss referral to sleep medicine to possibly obtain a sleep study.

## 2022-09-30 DIAGNOSIS — Z419 Encounter for procedure for purposes other than remedying health state, unspecified: Secondary | ICD-10-CM | POA: Diagnosis not present

## 2022-10-31 DIAGNOSIS — Z419 Encounter for procedure for purposes other than remedying health state, unspecified: Secondary | ICD-10-CM | POA: Diagnosis not present

## 2022-12-01 DIAGNOSIS — Z419 Encounter for procedure for purposes other than remedying health state, unspecified: Secondary | ICD-10-CM | POA: Diagnosis not present

## 2022-12-31 DIAGNOSIS — Z419 Encounter for procedure for purposes other than remedying health state, unspecified: Secondary | ICD-10-CM | POA: Diagnosis not present

## 2023-01-31 DIAGNOSIS — Z419 Encounter for procedure for purposes other than remedying health state, unspecified: Secondary | ICD-10-CM | POA: Diagnosis not present

## 2023-02-04 ENCOUNTER — Ambulatory Visit: Payer: Medicaid Other | Admitting: Family Medicine

## 2023-03-02 DIAGNOSIS — Z419 Encounter for procedure for purposes other than remedying health state, unspecified: Secondary | ICD-10-CM | POA: Diagnosis not present

## 2023-03-06 ENCOUNTER — Ambulatory Visit (INDEPENDENT_AMBULATORY_CARE_PROVIDER_SITE_OTHER): Payer: Medicaid Other | Admitting: Family Medicine

## 2023-03-06 ENCOUNTER — Ambulatory Visit
Admission: RE | Admit: 2023-03-06 | Discharge: 2023-03-06 | Disposition: A | Discharge status: Home / Self Care | Payer: Medicaid Other | Source: Ambulatory Visit | Attending: Family Medicine | Admitting: Family Medicine

## 2023-03-06 ENCOUNTER — Encounter: Payer: Self-pay | Admitting: Family Medicine

## 2023-03-06 VITALS — BP 118/62 | HR 88 | Temp 98.6°F | Ht 61.0 in | Wt 145.0 lb

## 2023-03-06 DIAGNOSIS — Z7689 Persons encountering health services in other specified circumstances: Secondary | ICD-10-CM | POA: Diagnosis not present

## 2023-03-06 DIAGNOSIS — M545 Low back pain, unspecified: Secondary | ICD-10-CM | POA: Diagnosis not present

## 2023-03-06 DIAGNOSIS — Z1231 Encounter for screening mammogram for malignant neoplasm of breast: Secondary | ICD-10-CM

## 2023-03-06 DIAGNOSIS — Z1159 Encounter for screening for other viral diseases: Secondary | ICD-10-CM | POA: Diagnosis not present

## 2023-03-06 DIAGNOSIS — G8929 Other chronic pain: Secondary | ICD-10-CM | POA: Diagnosis not present

## 2023-03-06 DIAGNOSIS — Z113 Encounter for screening for infections with a predominantly sexual mode of transmission: Secondary | ICD-10-CM

## 2023-03-06 DIAGNOSIS — E663 Overweight: Secondary | ICD-10-CM

## 2023-03-06 DIAGNOSIS — D649 Anemia, unspecified: Secondary | ICD-10-CM

## 2023-03-06 DIAGNOSIS — Z8719 Personal history of other diseases of the digestive system: Secondary | ICD-10-CM | POA: Diagnosis not present

## 2023-03-06 DIAGNOSIS — Z23 Encounter for immunization: Secondary | ICD-10-CM | POA: Diagnosis not present

## 2023-03-06 NOTE — Assessment & Plan Note (Signed)
CBC ordered and referral placed for GI.

## 2023-03-06 NOTE — Patient Instructions (Signed)
It was great to meet you today and I'm excited to have you join the [facility] practice. I hope you had a positive experience today! If you feel so inclined, please feel free to recommend our practice to friends and family. Mila Merry, FNP-C

## 2023-03-06 NOTE — Assessment & Plan Note (Signed)
Today your medical history was reviewed HM items reviewed as well as current concerns Please return for fasting labs and ASAP physical with PAP. Recommend 150 minutes of moderate intensity exercise weekly and consuming a well-balanced diet.

## 2023-03-06 NOTE — Progress Notes (Signed)
New Patient Office Visit  Subjective    Patient ID: Annette Whitehead, female    DOB: 08/18/1980  Age: 42 y.o. MRN: [id number]  CC:  Chief Complaint  Patient presents with   Establish Care    C/o of back pain and anemia.     HPI Annette Whitehead presents to establish care. Oriented to practice routines and expectations. No recent PCP. PMH includes anemia unknown origin, she does not have heavy menstrual cycles, does not eat much red meat or supplement iron, she does report occasional small amount of blood in her stool as recent as a few weeks ago, this has been happening on and off for over a year, she did have a hemorrhoid a year ago that has resolved. Denies abdominal pain, nausea, vomiting, constipation, diarrhea. Concerns include bilateral low back pain, has seen chiropractor for this, has also tried heat. Previously had right radiculopathy but not currently. 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 No trauma, injury, or fall. Has not had imaging. Denies saddle numbness, incontinence of stool or urine.   Breast CA screening: Mammogram status: Completed 8 years ago for lump in left breast and was abnormal. Repeat every year Cervical CA screening: approximate date 7 years ago and was normal Colon CA screening: never Tobacco: non-smoker STI: declines Vaccines:  TDaP 7 years ago, declines covid, flu today    No outpatient encounter medications on file as of 03/06/2023.   No facility-administered encounter medications on file as of 03/06/2023.    Past Medical History:  Diagnosis Date   Anemia    GERD (gastroesophageal reflux disease)    Headache    Medical history non-contributory     Past Surgical History:  Procedure Laterality Date   NO PAST SURGERIES      Family History  Problem Relation Age of Onset   Diabetes Mother    Hypertension Mother    Hyperlipidemia Mother    Osteoporosis Mother    Diabetes Father      Social History   Socioeconomic History   Marital status: Married    Spouse name: Not on file   Number of children: Not on file   Years of education: Not on file   Highest education level: 12th grade  Occupational History   Not on file  Tobacco Use   Smoking status: Never   Smokeless tobacco: Never  Vaping Use   Vaping status: Never Used  Substance and Sexual Activity   Alcohol use: No   Drug use: No   Sexual activity: Yes    Birth control/protection: None, I.U.D.  Other Topics Concern   Not on file  Social History Narrative   Not on file   Social Determinants of Health   Financial Resource Strain: Medium Risk (03/05/2023)   Overall Financial Resource Strain (CARDIA)    Difficulty of Paying Living Expenses: Somewhat hard  Food Insecurity: Food Insecurity Present (03/05/2023)   Hunger Vital Sign    Worried About Running Out of Food in the Last Year: Sometimes true    Ran Out of Food in the Last Year: Often true  Transportation Needs: No Transportation Needs (03/05/2023)   PRAPARE - Administrator, Civil Service (Medical): No    Lack of Transportation (Non-Medical): No  Physical Activity: Unknown (03/05/2023)   Exercise Vital Sign    Days of Exercise per Week: 0 days    Minutes of Exercise per Session: Not  on file  Stress: No Stress Concern Present (03/05/2023)   [employer] of Occupational Health - Occupational Stress Questionnaire    Feeling of Stress : Not at all  Social Connections: Moderately Integrated (03/05/2023)   Social Connection and Isolation Panel [NHANES]    Frequency of Communication with Friends and Family: More than three times a week    Frequency of Social Gatherings with Friends and Family: More than three times a week    Attends Religious Services: More than 4 times per year    Active Member of [employer] or Organizations: No    Attends Engineer, structural: Not on file    Marital Status: Married  Catering manager Violence:  Not on file    Review of Systems  All other systems reviewed and are negative.       Objective    BP 118/62 (BP Location: Left Arm)   Pulse 88   Temp 98.6 F (37 C)   Ht 5\' 1"  (1.549 m)   Wt 145 lb (65.8 kg)   LMP 02/09/2023 (Approximate)   SpO2 99%   BMI 27.40 kg/m   Physical Exam Vitals and nursing note reviewed.  Constitutional:      Appearance: Normal appearance. She is normal weight.  HENT:     Head: Normocephalic and atraumatic.  Cardiovascular:     Rate and Rhythm: Normal rate and regular rhythm.     Pulses: Normal pulses.     Heart sounds: Normal heart sounds.  Pulmonary:     Effort: Pulmonary effort is normal.     Breath sounds: Normal breath sounds.  Abdominal:     General: Bowel sounds are normal.     Palpations: Abdomen is soft.  Musculoskeletal:     Lumbar back: Bony tenderness present.       Back:  Skin:    General: Skin is warm and dry.  Neurological:     General: No focal deficit present.     Mental Status: She is alert and oriented to person, place, and time. Mental status is at baseline.  Psychiatric:        Mood and Affect: Mood normal.        Behavior: Behavior normal.        Thought Content: Thought content normal.        Judgment: Judgment normal.         Assessment & Plan:   Problem List Items Addressed This Visit     Chronic bilateral low back pain without sciatica    No red flags on exam. Paraspinal tenderness is present, will obtain x-ray and refer for PT. Encouraged NSAIDs, heat, stretching.      Relevant Orders   Ambulatory referral to Physical Therapy   DG Lumbar Spine Complete   Anemia    Repeat CBC ordered. She does report blood in her stool occasionally, will refer to GI.      Relevant Orders   CBC with Differential/Platelet   History of melena    CBC ordered and referral placed for GI.      Relevant Orders   Ambulatory referral to Gastroenterology   Encounter to establish care with new doctor - Primary     Today your medical history was reviewed HM items reviewed as well as current concerns Please return for fasting labs and ASAP physical with PAP. Recommend 150 minutes of moderate intensity exercise weekly and consuming a well-balanced diet.       Other Visit Diagnoses  Routine screening for STI (sexually transmitted infection)       Relevant Orders   RPR   Need for hepatitis C screening test       Relevant Orders   Hepatitis C antibody   Overweight (BMI 25.0-29.9)       Relevant Orders   COMPLETE METABOLIC PANEL WITH GFR   Lipid panel   TSH   Encounter for screening mammogram for malignant neoplasm of breast       Relevant Orders   MM DIGITAL SCREENING BILATERAL   Needs flu shot       Relevant Orders   Flu vaccine trivalent PF, 6mos and older(Flulaval,Afluria,Fluarix,Fluzone) (Completed)       Return for ASAP annual physical with labs 1 week prior.   Park Meo, FNP

## 2023-03-06 NOTE — Assessment & Plan Note (Signed)
Repeat CBC ordered. She does report blood in her stool occasionally, will refer to GI.

## 2023-03-06 NOTE — Assessment & Plan Note (Signed)
No red flags on exam. Paraspinal tenderness is present, will obtain x-ray and refer for PT. Encouraged NSAIDs, heat, stretching.

## 2023-04-02 DIAGNOSIS — Z419 Encounter for procedure for purposes other than remedying health state, unspecified: Secondary | ICD-10-CM | POA: Diagnosis not present

## 2023-04-03 ENCOUNTER — Ambulatory Visit: Payer: 59

## 2023-04-03 DIAGNOSIS — M6281 Muscle weakness (generalized): Secondary | ICD-10-CM | POA: Insufficient documentation

## 2023-04-03 DIAGNOSIS — M5459 Other low back pain: Secondary | ICD-10-CM | POA: Insufficient documentation

## 2023-04-03 DIAGNOSIS — R293 Abnormal posture: Secondary | ICD-10-CM | POA: Insufficient documentation

## 2023-04-03 NOTE — Therapy (Deleted)
 OUTPATIENT PHYSICAL THERAPY THORACOLUMBAR EVALUATION   Patient Name: Annette Whitehead MRN: [id number] DOB:1981-01-26, 43 y.o., female Today's Date: 04/03/2023  END OF SESSION:   Past Medical History:  Diagnosis Date   Anemia    GERD (gastroesophageal reflux disease)    Headache    Medical history non-contributory    Past Surgical History:  Procedure Laterality Date   NO PAST SURGERIES     Patient Active Problem List   Diagnosis Date Noted   Chronic bilateral low back pain without sciatica 03/06/2023   Anemia 03/06/2023   History of melena 03/06/2023   Encounter to establish care with new doctor 03/06/2023   Left breast mass 02/27/2016   IUD check up 02/27/2016    PCP: Annette Jeoffrey RAMAN, FNP  REFERRING PROVIDER: Kayla Jeoffrey RAMAN, FNP  REFERRING DIAG: [phone] (ICD-10-CM) - Chronic bilateral low back pain without sciatica  Rationale for Evaluation and Treatment: Rehabilitation  THERAPY DIAG:  No diagnosis found.  ONSET DATE: chronic 10+ years  SUBJECTIVE:                                                                                                                                                                                           SUBJECTIVE STATEMENT: ***  PERTINENT HISTORY:  Annette Whitehead presents to establish care. Oriented to practice routines and expectations. No recent PCP. PMH includes anemia unknown origin, she does not have heavy menstrual cycles, does not eat much red meat or supplement iron, she does report occasional small amount of blood in her stool as recent as a few weeks ago, this has been happening on and off for over a year, she did have a hemorrhoid a year ago that has resolved. Denies abdominal pain, nausea, vomiting, constipation, diarrhea. Concerns include bilateral low back pain, has seen chiropractor for this, has also tried heat. Previously had right radiculopathy but not currently. She works as a conservation officer, nature and stands  a lot. This pain is ongoing for over ten years. No trauma, injury, or fall. Has not had imaging. Denies saddle numbness, incontinence of stool or urine.  PAIN:  Are you having pain? {OPRCPAIN:27236}  PRECAUTIONS: None  RED FLAGS: None   WEIGHT BEARING RESTRICTIONS: No  FALLS:  Has patient fallen in last 6 months? No    OCCUPATION: cashier  PLOF: Independent  PATIENT GOALS: To manage my low back symptoms  NEXT MD VISIT: PRN  OBJECTIVE:  Note: Objective measures were completed at Evaluation unless otherwise noted.  DIAGNOSTIC FINDINGS:  CLINICAL DATA:  Chronic bilateral low back pain without sciatica   EXAM: LUMBAR SPINE - COMPLETE 4+ VIEW   COMPARISON:  None available   FINDINGS: Mild levoconvex curvature of the thoracolumbar spine. Five lumbar-type vertebral bodies are present. No significant listhesis. Moderate disc height loss and endplate spurring at L5-S1. IUD noted within the central pelvis.   IMPRESSION: Moderate degenerative changes at L5-S1.     Electronically Signed   By: Aliene Lloyd M.D.   On: 03/15/2023 14:19  PATIENT SURVEYS:  FOTO ***  MUSCLE LENGTH: Hamstrings: Right *** deg; Left *** deg Debby test: Right *** deg; Left *** deg  POSTURE: {posture:25561}  PALPATION: ***  LUMBAR ROM:   AROM eval  Flexion   Extension   Right lateral flexion   Left lateral flexion   Right rotation   Left rotation    (Blank rows = not tested)  LOWER EXTREMITY ROM:     {AROM/PROM:27142}  Right eval Left eval  Hip flexion    Hip extension    Hip abduction    Hip adduction    Hip internal rotation    Hip external rotation    Knee flexion    Knee extension    Ankle dorsiflexion    Ankle plantarflexion    Ankle inversion    Ankle eversion     (Blank rows = not tested)  LOWER EXTREMITY MMT:    MMT Right eval Left eval  Hip flexion    Hip extension    Hip abduction    Hip adduction    Hip internal rotation    Hip external  rotation    Knee flexion    Knee extension    Ankle dorsiflexion    Ankle plantarflexion    Ankle inversion    Ankle eversion     (Blank rows = not tested)  LUMBAR SPECIAL TESTS:  Straight leg raise test: {pos/neg:25243} and Slump test: {pos/neg:25243}  FUNCTIONAL TESTS:  30 seconds chair stand test  GAIT: Distance walked: 81ft x2 Assistive device utilized: None Level of assistance: Complete Independence Comments: unremarkable  TREATMENT DATE: 04/03/23 Eval                                                                                                                                 PATIENT EDUCATION:  Education details: Discussed eval findings, rehab rationale and POC and patient is in agreement  Person educated: Patient Education method: Explanation Education comprehension: verbalized understanding and needs further education  HOME EXERCISE PROGRAM: ***  ASSESSMENT:  CLINICAL IMPRESSION: Patient is a *** y.o. *** who was seen today for physical therapy evaluation and treatment for ***.   OBJECTIVE IMPAIRMENTS: {opptimpairments:25111}.   ACTIVITY LIMITATIONS: {activitylimitations:27494}  PERSONAL FACTORS: {Personal factors:25162} are also affecting patient's functional outcome.   REHAB POTENTIAL: Good  CLINICAL DECISION MAKING: Stable/uncomplicated  EVALUATION COMPLEXITY: Low   GOALS: Goals reviewed with patient? No  SHORT TERM GOALS: Target date: ***  Patient to demonstrate independence in HEP  Baseline: Goal status: INITIAL  2.  *** Baseline:  Goal status: INITIAL  3.  *** Baseline:  Goal status: INITIAL  4.  *** Baseline:  Goal status: INITIAL  5.  *** Baseline:  Goal status: INITIAL  6.  *** Baseline:  Goal status: INITIAL  LONG TERM GOALS: Target date: ***  Patient will increase 30s chair stand reps from *** to *** with/without arms to demonstrate and improved functional ability with less pain/difficulty as well as reduce fall  risk.  Baseline:  Goal status: INITIAL  2.  Patient will acknowledge ***/10 pain at least once during episode of care   Baseline:  Goal status: INITIAL  3.  Patient will score at least ***% on FOTO to signify clinically meaningful improvement in functional abilities.   Baseline:  Goal status: INITIAL  4.  *** Baseline:  Goal status: INITIAL  5.  *** Baseline:  Goal status: INITIAL  6.  *** Baseline:  Goal status: INITIAL  PLAN:  PT FREQUENCY: 1-2x/week  PT DURATION: 6 weeks  PLANNED INTERVENTIONS: 97164- PT Re-evaluation, 97110-Therapeutic exercises, 97530- Therapeutic activity, 97112- Neuromuscular re-education, 97535- Self Care, 02859- Manual therapy, Patient/Family education, and Dry Needling.  PLAN FOR NEXT SESSION: HEP review and update, manual techniques as appropriate, aerobic tasks, ROM and flexibility activities, strengthening and PREs, TPDN, gait and balance training as needed     Reyes CHRISTELLA Kohut, PT 04/03/2023, 3:13 PM

## 2023-04-14 ENCOUNTER — Other Ambulatory Visit: Payer: Medicaid Other

## 2023-04-16 ENCOUNTER — Encounter (HOSPITAL_BASED_OUTPATIENT_CLINIC_OR_DEPARTMENT_OTHER): Payer: Self-pay | Admitting: Emergency Medicine

## 2023-04-16 ENCOUNTER — Other Ambulatory Visit: Payer: Self-pay

## 2023-04-16 DIAGNOSIS — R519 Headache, unspecified: Secondary | ICD-10-CM | POA: Insufficient documentation

## 2023-04-16 DIAGNOSIS — Z20822 Contact with and (suspected) exposure to covid-19: Secondary | ICD-10-CM | POA: Diagnosis not present

## 2023-04-16 LAB — RESP PANEL BY RT-PCR (RSV, FLU A&B, COVID)  RVPGX2
Influenza A by PCR: NEGATIVE
Influenza B by PCR: NEGATIVE
Resp Syncytial Virus by PCR: NEGATIVE
SARS Coronavirus 2 by RT PCR: NEGATIVE

## 2023-04-16 NOTE — ED Triage Notes (Signed)
 Headache, frontal into back, some nausea  Started tomorrow. Tylenol  around 3 PM No relief No visual issues

## 2023-04-17 ENCOUNTER — Emergency Department (HOSPITAL_BASED_OUTPATIENT_CLINIC_OR_DEPARTMENT_OTHER)
Admission: EM | Admit: 2023-04-17 | Discharge: 2023-04-17 | Disposition: A | Discharge status: Home / Self Care | Payer: Medicaid Other | Attending: Emergency Medicine | Admitting: Emergency Medicine

## 2023-04-17 ENCOUNTER — Emergency Department (HOSPITAL_BASED_OUTPATIENT_CLINIC_OR_DEPARTMENT_OTHER): Payer: Medicaid Other

## 2023-04-17 DIAGNOSIS — R519 Headache, unspecified: Secondary | ICD-10-CM | POA: Diagnosis not present

## 2023-04-17 LAB — CBC WITH DIFFERENTIAL/PLATELET
Absolute Lymphocytes: 1273 {cells}/uL (ref 850–3900)
Absolute Monocytes: 232 {cells}/uL (ref 200–950)
Basophils Absolute: 30 {cells}/uL (ref 0–200)
Basophils Relative: 0.7 %
Eosinophils Absolute: 30 {cells}/uL (ref 15–500)
Eosinophils Relative: 0.7 %
HCT: 38.5 % (ref 35.0–45.0)
Hemoglobin: 12.1 g/dL (ref 11.7–15.5)
MCH: 24.3 pg — ABNORMAL LOW (ref 27.0–33.0)
MCHC: 31.4 g/dL — ABNORMAL LOW (ref 32.0–36.0)
MCV: 77.3 fL — ABNORMAL LOW (ref 80.0–100.0)
MPV: 11.3 fL (ref 7.5–12.5)
Monocytes Relative: 5.4 %
Neutro Abs: 2735 {cells}/uL (ref 1500–7800)
Neutrophils Relative %: 63.6 %
Platelets: 223 10*3/uL (ref 140–400)
RBC: 4.98 10*6/uL (ref 3.80–5.10)
RDW: 14.1 % (ref 11.0–15.0)
Total Lymphocyte: 29.6 %
WBC: 4.3 10*3/uL (ref 3.8–10.8)

## 2023-04-17 LAB — TEST AUTHORIZATION

## 2023-04-17 LAB — COMPLETE METABOLIC PANEL WITH GFR
AG Ratio: 1.5 (calc) (ref 1.0–2.5)
ALT: 17 U/L (ref 6–29)
AST: 18 U/L (ref 10–30)
Albumin: 4 g/dL (ref 3.6–5.1)
Alkaline phosphatase (APISO): 63 U/L (ref 31–125)
BUN: 11 mg/dL (ref 7–25)
CO2: 25 mmol/L (ref 20–32)
Calcium: 8.9 mg/dL (ref 8.6–10.2)
Chloride: 104 mmol/L (ref 98–110)
Creat: 0.62 mg/dL (ref 0.50–0.99)
Globulin: 2.7 g/dL (ref 1.9–3.7)
Glucose, Bld: 104 mg/dL — ABNORMAL HIGH (ref 65–99)
Potassium: 4.4 mmol/L (ref 3.5–5.3)
Sodium: 138 mmol/L (ref 135–146)
Total Bilirubin: 0.3 mg/dL (ref 0.2–1.2)
Total Protein: 6.7 g/dL (ref 6.1–8.1)
eGFR: 115 mL/min/{1.73_m2} (ref >=60)

## 2023-04-17 LAB — HEMOGLOBIN A1C
Hgb A1c MFr Bld: 5.8 %{Hb} — ABNORMAL HIGH (ref <5.7)
Mean Plasma Glucose: 120 mg/dL
eAG (mmol/L): 6.6 mmol/L

## 2023-04-17 LAB — LIPID PANEL
Cholesterol: 164 mg/dL (ref <200)
HDL: 37 mg/dL — ABNORMAL LOW (ref >=50)
LDL Cholesterol (Calc): 95 mg/dL
Non-HDL Cholesterol (Calc): 127 mg/dL (ref <130)
Total CHOL/HDL Ratio: 4.4 (calc) (ref <5.0)
Triglycerides: 228 mg/dL — ABNORMAL HIGH (ref <150)

## 2023-04-17 LAB — RPR: RPR Ser Ql: NONREACTIVE

## 2023-04-17 LAB — HCG, SERUM, QUALITATIVE: Preg, Serum: NEGATIVE

## 2023-04-17 LAB — HEPATITIS C ANTIBODY: Hepatitis C Ab: NONREACTIVE

## 2023-04-17 LAB — TSH: TSH: 1.45 m[IU]/L

## 2023-04-17 MED ORDER — DEXAMETHASONE SODIUM PHOSPHATE 10 MG/ML IJ SOLN
10.0000 mg | Freq: Once | INTRAMUSCULAR | Status: AC
  Administered 2023-04-17: 10 mg via INTRAVENOUS
  Filled 2023-04-17: qty 1

## 2023-04-17 MED ORDER — METOCLOPRAMIDE HCL 5 MG/ML IJ SOLN
10.0000 mg | Freq: Once | INTRAMUSCULAR | Status: AC
  Administered 2023-04-17: 10 mg via INTRAVENOUS
  Filled 2023-04-17: qty 2

## 2023-04-17 MED ORDER — KETOROLAC TROMETHAMINE 30 MG/ML IJ SOLN
30.0000 mg | Freq: Once | INTRAMUSCULAR | Status: AC
Start: 2023-04-17 — End: 2023-04-17
  Administered 2023-04-17: 30 mg via INTRAVENOUS
  Filled 2023-04-17: qty 1

## 2023-04-17 MED ORDER — DIPHENHYDRAMINE HCL 50 MG/ML IJ SOLN
25.0000 mg | Freq: Once | INTRAMUSCULAR | Status: AC
  Administered 2023-04-17: 25 mg via INTRAVENOUS
  Filled 2023-04-17: qty 1

## 2023-04-17 MED ORDER — SODIUM CHLORIDE 0.9 % IV BOLUS
1000.0000 mL | Freq: Once | INTRAVENOUS | Status: AC
  Administered 2023-04-17: 1000 mL via INTRAVENOUS

## 2023-04-17 NOTE — ED Notes (Signed)
Patient transported to CT 

## 2023-04-17 NOTE — ED Provider Notes (Signed)
[place] [facility] DEPARTMENT AT [facility] Provider Note   CSN: [id number] Arrival date & time: 04/16/23  1654     History  Chief Complaint  Patient presents with   Headache    Annette Whitehead is a 43 y.o. female.  Patient is a 43 year old female with past medical history of anemia and GERD.  Patient presenting today with complaints of headache.  She reports headaches that have been increasing in frequency over the past several weeks.  She experienced another headache today who presents for evaluation of this.  She describes pressure to the front of her head that radiates to the back.  There are no associated visual disturbances, weakness, numbness, fevers, chills, or stiff neck.  She has been taking Tylenol with minimal relief.  The history is provided by the patient.       Home Medications Prior to Admission medications   Not on File      Allergies    Patient has no known allergies.    Review of Systems   Review of Systems  All other systems reviewed and are negative.   Physical Exam Updated Vital Signs BP 130/73 (BP Location: Right Arm)   Pulse 74   Temp (!) 97.4 F (36.3 C) (Oral)   Resp 20   SpO2 100%  Physical Exam Vitals and nursing note reviewed.  Constitutional:      General: She is not in acute distress.    Appearance: She is well-developed. She is not diaphoretic.  HENT:     Head: Normocephalic and atraumatic.  Eyes:     General: No visual field deficit.    Extraocular Movements: Extraocular movements intact.     Pupils: Pupils are equal, round, and reactive to light.  Cardiovascular:     Rate and Rhythm: Normal rate and regular rhythm.     Heart sounds: No murmur heard.    No friction rub. No gallop.  Pulmonary:     Effort: Pulmonary effort is normal. No respiratory distress.     Breath sounds: Normal breath sounds. No wheezing.  Abdominal:     General: Bowel sounds are normal. There is no distension.     Palpations:  Abdomen is soft.     Tenderness: There is no abdominal tenderness.  Musculoskeletal:        General: Normal range of motion.     Cervical back: Normal range of motion and neck supple.  Skin:    General: Skin is warm and dry.  Neurological:     General: No focal deficit present.     Mental Status: She is alert and oriented to person, place, and time.     Cranial Nerves: No cranial nerve deficit or facial asymmetry.     ED Results / Procedures / Treatments   Labs (all labs ordered are listed, but only abnormal results are displayed) Labs Reviewed  RESP PANEL BY RT-PCR (RSV, FLU A&B, COVID)  [id number]    EKG None  Radiology No results found.  Procedures Procedures    Medications Ordered in ED Medications  sodium chloride 0.9 % bolus 1,000 mL (has no administration in time range)  ketorolac (TORADOL) 30 MG/ML injection 30 mg (has no administration in time range)  dexamethasone (DECADRON) injection 10 mg (has no administration in time range)  diphenhydrAMINE (BENADRYL) injection 25 mg (has no administration in time range)  metoCLOPramide (REGLAN) injection 10 mg (has no administration in time range)    ED Course/ Medical Decision Making/ A&P  Patient is a 43 year old female presenting with complaints of headache.  She has been experiencing headaches for the past several weeks and seem to be increasing in frequency.  Patient arrives with stable vital signs and is afebrile.  Physical examination is unremarkable and patient is neurologically intact.  Pregnancy test is negative.  Patient did have laboratory studies obtained 2 days ago, so I do not feel they need to be redone.  These were reviewed and are all basically unremarkable.  CT scan of the head obtained showing no acute process.  Patient has received a migraine cocktail consisting of normal saline, Benadryl, Decadron, Reglan, and Toradol and patient states that she feels significantly improved.  At this point, I doubt any  emergent pathology and feel as though patient can safely be discharged.  I will advise rotating Tylenol and ibuprofen, avoidance of caffeine, and follow-up as needed if symptoms worsen or change.  Final Clinical Impression(s) / ED Diagnoses Final diagnoses:  None    Rx / DC Orders ED Discharge Orders     None         Geoffery Lyons, MD 04/17/23 [phone]

## 2023-04-17 NOTE — ED Notes (Signed)
 RN reviewed discharge instructions with pt. Pt verbalized understanding and had no further questions. VSS upon discharge.  

## 2023-04-17 NOTE — Discharge Instructions (Signed)
Take ibuprofen 600 mg rotated with Tylenol 1000 mg every 4 hours as needed for pain.  Follow-up with primary doctor if symptoms persist, and return to the ER if your symptoms significantly worsen or change.

## 2023-04-21 ENCOUNTER — Other Ambulatory Visit: Payer: Medicaid Other

## 2023-04-28 ENCOUNTER — Ambulatory Visit (INDEPENDENT_AMBULATORY_CARE_PROVIDER_SITE_OTHER): Payer: 59 | Admitting: Family Medicine

## 2023-04-28 ENCOUNTER — Encounter: Payer: Self-pay | Admitting: Family Medicine

## 2023-04-28 VITALS — BP 120/70 | HR 84 | Temp 98.7°F | Ht 61.0 in | Wt 144.0 lb

## 2023-04-28 DIAGNOSIS — E781 Pure hyperglyceridemia: Secondary | ICD-10-CM | POA: Diagnosis not present

## 2023-04-28 DIAGNOSIS — Z124 Encounter for screening for malignant neoplasm of cervix: Secondary | ICD-10-CM | POA: Insufficient documentation

## 2023-04-28 DIAGNOSIS — R7303 Prediabetes: Secondary | ICD-10-CM | POA: Insufficient documentation

## 2023-04-28 DIAGNOSIS — Z0000 Encounter for general adult medical examination without abnormal findings: Secondary | ICD-10-CM | POA: Insufficient documentation

## 2023-04-28 DIAGNOSIS — H547 Unspecified visual loss: Secondary | ICD-10-CM

## 2023-04-28 DIAGNOSIS — J069 Acute upper respiratory infection, unspecified: Secondary | ICD-10-CM | POA: Insufficient documentation

## 2023-04-28 DIAGNOSIS — Z0001 Encounter for general adult medical examination with abnormal findings: Secondary | ICD-10-CM | POA: Diagnosis not present

## 2023-04-28 DIAGNOSIS — Z975 Presence of (intrauterine) contraceptive device: Secondary | ICD-10-CM

## 2023-04-28 NOTE — Assessment & Plan Note (Signed)
Your labs showed elevated cholesterol. I recommend consuming a heart healthy diet such as Mediterranean diet or DASH diet with whole grains, fruits, vegetable, fish, lean meats, nuts, and olive oil. Limit sweets and processed foods. I also encourage moderate intensity exercise 150 minutes weekly. This is 3-5 times weekly for 30-50 minutes each session. Goal should be pace of 3 miles/hours, or walking 1.5 miles in 30 minutes. The 10-year ASCVD risk score (Arnett DK, et al., 2019) is: 0.8%

## 2023-04-28 NOTE — Assessment & Plan Note (Addendum)
Routine PAP done today. Declines additional STD testing. IUD strings in place, she would like to discuss permanent birth control options. Will refer to [facility]

## 2023-04-28 NOTE — Assessment & Plan Note (Signed)

## 2023-04-28 NOTE — Progress Notes (Signed)
Subjective:   Annette Whitehead is a 43 y.o. female for annual routine Pap and checkup. Other concerns include 3-4 days of cough and congestion. She denies SOB, wheezing, fever, chills, body aches, sinus pressure, copious sinus drainage. She does report her symptoms are beginning to improve.  No current outpatient medications on file.   No current facility-administered medications for this visit.   Allergies: Patient has no known allergies.  Patient's last menstrual period was 04/07/2023 (approximate). Past Medical History:  Diagnosis Date   Anemia    GERD (gastroesophageal reflux disease)    Headache    Medical history non-contributory    Past Surgical History:  Procedure Laterality Date   NO PAST SURGERIES       ROS:  Feeling well. No dyspnea or chest pain on exertion.  No abdominal pain, change in bowel habits, black or bloody stools.  No urinary tract symptoms. GYN ROS: normal menses, no abnormal bleeding, pelvic pain or discharge. No neurological complaints.  Objective:   The patient appears well, alert, oriented x 3, in no distress. BP 120/70   Pulse 84   Temp 98.7 F (37.1 C) (Oral)   Ht 5\' 1"  (1.549 m)   Wt 144 lb (65.3 kg)   LMP 04/07/2023 (Approximate)   SpO2 98%   BMI 27.21 kg/m  ENT normal.  Neck supple. No adenopathy or thyromegaly. PERLA. Lungs are clear, good air entry, no wheezes, rhonchi or rales. S1 and S2 normal, no murmurs, regular rate and rhythm. Abdomen soft without tenderness, guarding, mass or organomegaly. Extremities show no edema, normal peripheral pulses. Neurological is normal, no focal findings.  BREAST EXAM: not examined  PELVIC EXAM: normal external genitalia, vulva, vagina, cervix, uterus and adnexa, CERVIX: normal appearing cervix without discharge or lesions, PAP: Pap smear done today, exam chaperoned by Arta Silence, IUD strings observed  Assessment & Plan:   well woman  PLAN:  mammogram pap smear return annually or prn     Physical exam, annual Assessment & Plan: Today your medical history was reviewed and routine physical exam with labs was performed. Recommend 150 minutes of moderate intensity exercise weekly and consuming a well-balanced diet. Advised to stop smoking if a smoker, avoid smoking if a non-smoker, limit alcohol consumption to 1 drink per day for women and 2 drinks per day for men, and avoid illicit drug use. Counseled on safe sex practices and offered STI testing today. Counseled on the importance of sunscreen use. Counseled in mental health awareness and when to seek medical care. Vaccine maintenance discussed. Appropriate health maintenance items reviewed. Return to office in 1 year for annual physical exam.    Cervical cancer screening Assessment & Plan: Routine PAP done today. Declines additional STD testing. IUD strings in place, she would like to discuss permanent birth control options. Will refer to OBGYN  Orders: -     Pap, TP Imaging w/ CT/GC and w/ HPV RNA, rflx HPV Type 16/18 -     Ambulatory referral to Obstetrics / Gynecology  Prediabetes Assessment & Plan: Recommend heart healthy diet such as Mediterranean diet with whole grains, fruits, vegetable, fish, lean meats, nuts, and olive oil. Limit salt. Encouraged moderate walking, 3-5 times/week for 30-50 minutes each session. Aim for at least 150 minutes.week. Goal should be pace of 3 miles/hours, or walking 1.5 miles in 30 minutes. Seek medical care for urinary frequency, extreme thirst, vision changes, lightheadedness, dizziness.     Hypertriglyceridemia Assessment & Plan: Your labs showed elevated cholesterol.  I recommend consuming a heart healthy diet such as Mediterranean diet or DASH diet with whole grains, fruits, vegetable, fish, lean meats, nuts, and olive oil. Limit sweets and processed foods. I also encourage moderate intensity exercise 150 minutes weekly. This is 3-5 times weekly for 30-50 minutes each session. Goal  should be pace of 3 miles/hours, or walking 1.5 miles in 30 minutes. The 10-year ASCVD risk score (Arnett DK, et al., 2019) is: 0.8%    Viral URI with cough Assessment & Plan: Covid, flu, RSV test performed. Reassured patient that symptoms and exam findings are most consistent with a viral upper respiratory infection and explained lack of efficacy of antibiotics against viruses.  Discussed expected course and features suggestive of secondary bacterial infection.  Continue supportive care. Increase fluid intake with water or electrolyte solution like pedialyte. Encouraged acetaminophen as needed for fever/pain. Encouraged salt water gargling, chloraseptic spray and throat lozenges. Encouraged OTC guaifenesin. Encouraged saline sinus flushes and/or neti with humidified air.    Orders: -     SARS-CoV-2 RNA, Influenza A/B, and RSV RNA, Qualitative NAAT  Family planning, IUD (intrauterine device) in place -     Ambulatory referral to Obstetrics / Gynecology  Vision impairment -     Ambulatory referral to Ophthalmology     Follow up plan: Return in about 1 year (around 04/27/2024) for annual physical with labs 1 week prior.  Park Meo, FNP

## 2023-04-28 NOTE — Assessment & Plan Note (Signed)
Recommend heart healthy diet such as Mediterranean diet with whole grains, fruits, vegetable, fish, lean meats, nuts, and olive oil. Limit salt. Encouraged moderate walking, 3-5 times/week for 30-50 minutes each session. Aim for at least 150 minutes.week. Goal should be pace of 3 miles/hours, or walking 1.5 miles in 30 minutes. Seek medical care for urinary frequency, extreme thirst, vision changes, lightheadedness, dizziness.

## 2023-04-28 NOTE — Progress Notes (Deleted)
Complete physical exam  Patient: [facility]net[facility]   DOB: 04-04-1980   43 y.o. Female  MRN: [id number]  Subjective:    Chief Complaint  Patient presents with   Follow-up    physical (date of last cpe unknown) - JBG\\    Annette Whitehead is a 42 y.o. female who presents today for a complete physical exam. She reports consuming a {diet types:17450} diet. {types:19826} She generally feels {DESC; WELL/FAIRLY WELL/POORLY:18703}. She reports sleeping {DESC; WELL/FAIRLY WELL/POORLY:18703}. She {does/does not:200015} have additional problems to discuss today.    Most recent fall risk assessment:    03/06/2023    9:30 AM  Fall Risk   Falls in the past year? 0  Number falls in past yr: 0  Injury with Fall? 0     Most recent depression screenings:    03/06/2023    9:30 AM 05/15/2015    9:30 AM  PHQ 2/9 Scores  PHQ - 2 Score 0 0  PHQ- 9 Score 0     {VISON DENTAL STD PSA (Optional):27386}  Patient Active Problem List   Diagnosis Date Noted   Physical exam, annual 04/28/2023   Cervical cancer screening 04/28/2023   Prediabetes 04/28/2023   Hypertriglyceridemia 04/28/2023   Chronic bilateral low back pain without sciatica 03/06/2023   Anemia 03/06/2023   History of melena 03/06/2023   Encounter to establish care with new doctor 03/06/2023   Left breast mass 02/27/2016   IUD check up 02/27/2016   Past Medical History:  Diagnosis Date   Anemia    GERD (gastroesophageal reflux disease)    Headache    Medical history non-contributory    Past Surgical History:  Procedure Laterality Date   NO PAST SURGERIES     Social History   Tobacco Use   Smoking status: Never   Smokeless tobacco: Never  Vaping Use   Vaping status: Never Used  Substance Use Topics   Alcohol use: No   Drug use: No   Family History  Problem Relation Age of Onset   Diabetes Mother    Hypertension Mother    Hyperlipidemia Mother    Osteoporosis Mother    Diabetes Father    No  Known Allergies    Patient Care Team: Park Meo, FNP as PCP - General (Family Medicine)   No outpatient medications prior to visit.   No facility-administered medications prior to visit.    ROS        Objective:     BP 120/70   Pulse 84   Temp 98.7 F (37.1 C) (Oral)   Ht 5\' 1"  (1.549 m)   Wt 144 lb (65.3 kg)   LMP 04/07/2023 (Approximate)   SpO2 98%   BMI 27.21 kg/m  BP Readings from Last 3 Encounters:  04/28/23 120/70  04/17/23 113/60  03/06/23 118/62   Wt Readings from Last 3 Encounters:  04/28/23 144 lb (65.3 kg)  03/06/23 145 lb (65.8 kg)  02/27/16 136 lb (61.7 kg)      Physical Exam   No results found for any visits on 04/28/23. Last CBC Lab Results  Component Value Date   WBC 4.3 04/14/2023   HGB 12.1 04/14/2023   HCT 38.5 04/14/2023   MCV 77.3 (L) 04/14/2023   MCH 24.3 (L) 04/14/2023   RDW 14.1 04/14/2023   PLT 223 04/14/2023   Last metabolic panel Lab Results  Component Value Date   GLUCOSE 104 (H) 04/14/2023   NA 138 04/14/2023   K  4.4 04/14/2023   CL 104 04/14/2023   CO2 25 04/14/2023   BUN 11 04/14/2023   CREATININE 0.62 04/14/2023   EGFR 115 04/14/2023   CALCIUM 8.9 04/14/2023   PROT 6.7 04/14/2023   ALBUMIN 3.8 09/03/2022   BILITOT 0.3 04/14/2023   ALKPHOS 66 09/03/2022   AST 18 04/14/2023   ALT 17 04/14/2023   ANIONGAP 8 09/03/2022   Last lipids Lab Results  Component Value Date   CHOL 164 04/14/2023   HDL 37 (L) 04/14/2023   LDLCALC 95 04/14/2023   TRIG 228 (H) 04/14/2023   CHOLHDL 4.4 04/14/2023   Last hemoglobin A1c Lab Results  Component Value Date   HGBA1C 5.8 (H) 04/14/2023   Last thyroid functions Lab Results  Component Value Date   TSH 1.45 04/14/2023   Last vitamin D Lab Results  Component Value Date   VD25OH 22 (L) 03/29/2015   Last vitamin B12 and Folate Lab Results  Component Value Date   VITAMINB12 346 09/03/2022      Assessment & Plan:    Routine Health Maintenance and  Physical Exam  Immunization History  Administered Date(s) Administered   Influenza, Seasonal, Injecte, Preservative Fre 03/06/2023   Tdap 10/03/2015    Health Maintenance  Topic Date Due   Cervical Cancer Screening (HPV/Pap Cotest)  02/26/2021   COVID-19 Vaccine (1 - 2024-25 season) Never done   DTaP/Tdap/Td (2 - Td or Tdap) 10/02/2025   INFLUENZA VACCINE  Completed   Hepatitis C Screening  Completed   HIV Screening  Completed   HPV VACCINES  Aged Out    Discussed health benefits of physical activity, and encouraged her to engage in regular exercise appropriate for her age and condition.  Problem List Items Addressed This Visit     Physical exam, annual - Primary   Cervical cancer screening   Prediabetes   Hypertriglyceridemia   No follow-ups on file.     Park Meo, FNP

## 2023-04-28 NOTE — Assessment & Plan Note (Signed)
Covid, flu, RSV test performed. Reassured patient that symptoms and exam findings are most consistent with a viral upper respiratory infection and explained lack of efficacy of antibiotics against viruses.  Discussed expected course and features suggestive of secondary bacterial infection.  Continue supportive care. Increase fluid intake with water or electrolyte solution like pedialyte. Encouraged acetaminophen as needed for fever/pain. Encouraged salt water gargling, chloraseptic spray and throat lozenges. Encouraged OTC guaifenesin. Encouraged saline sinus flushes and/or neti with humidified air.

## 2023-04-29 NOTE — Therapy (Signed)
OUTPATIENT PHYSICAL THERAPY THORACOLUMBAR EVALUATION   Patient Name: Annette Whitehead MRN: [id number] DOB:07/20/1980, 43 y.o., female Today's Date: 05/01/2023  END OF SESSION:  PT End of Session - 05/01/23 7829     Visit Number 1    Number of Visits 7    Date for PT Re-Evaluation 06/20/23    Authorization Type [employer]    PT Start Time 1500    PT Stop Time 1545    PT Time Calculation (min) 45 min    Activity Tolerance Patient tolerated treatment well    Behavior During Therapy [facility] for tasks assessed/performed             Past Medical History:  Diagnosis Date   Anemia    GERD (gastroesophageal reflux disease)    Headache    Medical history non-contributory    Past Surgical History:  Procedure Laterality Date   NO PAST SURGERIES     Patient Active Problem List   Diagnosis Date Noted   Physical exam, annual 04/28/2023   Cervical cancer screening 04/28/2023   Prediabetes 04/28/2023   Hypertriglyceridemia 04/28/2023   Viral URI with cough 04/28/2023   Chronic bilateral low back pain without sciatica 03/06/2023   Anemia 03/06/2023   History of melena 03/06/2023   Encounter to establish care with new doctor 03/06/2023   Left breast mass 02/27/2016   IUD check up 02/27/2016    PCP: Park Meo, FNP  REFERRING PROVIDER: Park Meo, FNP  REFERRING DIAG: [phone] (ICD-10-CM) - Chronic bilateral low back pain without sciatica  Rationale for Evaluation and Treatment: Rehabilitation  THERAPY DIAG:  Other low back pain  Abnormal posture  Muscle weakness (generalized)  ONSET DATE: chronic 10+ years  SUBJECTIVE:                                                                                                                                                                                           SUBJECTIVE STATEMENT: Pt reports she always have low back pain across her belt line with some days worse than others. The R low back is  painful more often than the L.   PERTINENT HISTORY:  [name] presents to establish care. Oriented to practice routines and expectations. No recent PCP. PMH includes anemia unknown origin, she does not have heavy menstrual cycles, does not eat much red meat or supplement iron, she does report occasional small amount of blood in her stool as recent as a few weeks ago, this has been happening on and off for over a year, she did have a hemorrhoid a year ago that has resolved. Denies abdominal  pain, nausea, vomiting, constipation, diarrhea. Concerns include bilateral low back pain, has seen chiropractor for this, has also tried heat. Previously had right radiculopathy but not currently. 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 No trauma, injury, or fall. Has not had imaging. Denies saddle numbness, incontinence of stool or urine.  PAIN:  Are you having pain? Yes: NPRS scale: 3/10. Range in the last week prior to PT: 3-7/10. :Pain location: Across the belt line of her low back, R>L Pain description: Hurt Aggravating factors: Lying down on either side or her back, bending, turning, prolonged standing, lifting Relieving factors: Rest, heating pad, hot shower.pain medication  PRECAUTIONS: None  RED FLAGS: None   WEIGHT BEARING RESTRICTIONS: No  FALLS:  Has patient fallen in last 6 months? 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  DIAGNOSTIC FINDINGS:  LUMBAR SPINE - COMPLETE 4+ VIEW   COMPARISON:  None available   FINDINGS: Mild levoconvex curvature of the thoracolumbar spine. Five lumbar-type vertebral bodies are present. No significant listhesis. Moderate disc height loss and endplate spurring at L5-S1. IUD  noted within the central pelvis.   IMPRESSION: Moderate degenerative changes at L5-S1.  PATIENT SURVEYS:  Mod Oswestry 36%  MUSCLE LENGTH: Hamstrings: Right tight deg; Left tight deg Thomas test: Right tight deg; Left tight deg  POSTURE: forward head, increased thoracic kyphosis, and  lateral shift R shoulder on hips  PALPATION: Not TTP  LUMBAR ROM:   AROM eval  Flexion Full; p upon return  Extension Full, R p at end range  Right lateral flexion Full, R p at end range  Left lateral flexion Full, pulling p R  Right rotation Full, min p  Left rotation Full, min p   (Blank rows = not tested)  LOWER EXTREMITY ROM:     WNLs for functional movements Active  Right eval Left eval  Hip flexion    Hip extension    Hip abduction    Hip adduction    Hip internal rotation    Hip external rotation    Knee flexion    Knee extension    Ankle dorsiflexion    Ankle plantarflexion    Ankle inversion    Ankle eversion     (Blank rows = not tested)  LOWER EXTREMITY MMT:    Myotome screen negative. Weak core. MMT Right eval Left eval  Hip flexion    Hip extension    Hip abduction    Hip adduction    Hip internal rotation    Hip external rotation    Knee flexion    Knee extension    Ankle dorsiflexion    Ankle plantarflexion    Ankle inversion    Ankle eversion     (Blank rows = not tested)  LUMBAR SPECIAL TESTS:  Straight leg raise test: Negative and Slump test: Negative  FUNCTIONAL TESTS:  30 seconds chair stand test  GAIT: Distance walked: 23ft x2 Assistive device utilized: None Level of assistance: Complete Independence Comments: unremarkable  TREATMENT DATE:  [facility] Adult PT Treatment:  DATE: 04/30/23 Therapeutic Exercise: Developed, instructed in, and pt completed therex as noted in HEP                                                                                                                              PATIENT EDUCATION:  Education details: Discussed eval findings, rehab rationale and POC and patient is in agreement  Person educated: Patient Education method: Explanation Education comprehension: verbalized understanding and needs further education  HOME EXERCISE PROGRAM: Access Code: [email or web address] URL: [email or web address] Date: 04/30/2023 Prepared by: Joellyn Rued  Exercises - Seated Flexion Stretch with Swiss Ball  - 3 x daily - 7 x weekly - 1 sets - 10 reps - 5-10 hold - Supine Lower Trunk Rotation  - 2 x daily - 7 x weekly - 1 sets - 10 reps - 5 hold - Curl Up with Reach  - 2 x daily - 7 x weekly - 2 sets - 10 reps - 3 hold  ASSESSMENT:  CLINICAL IMPRESSION: Patient is a 43 y.o. female who was seen today for physical therapy evaluation and treatment for M54.50,G89.29 (ICD-10-CM) - Chronic bilateral low back pain without sciatica. Pt presents with trunk ext and and R SB reproducing pt's pain and increased lumbar lordosis. Pt is not TTP. X-rays revealed mild levoconvex curvature of the thoracolumbar spine and moderate disc height loss and endplate spurring at L5-S1. An HEP was provided. Pt will benefit from skilled PT 1w6 to address impairments to optimize back function with less pain.   OBJECTIVE IMPAIRMENTS: decreased activity tolerance, decreased strength, postural dysfunction, and pain.   ACTIVITY LIMITATIONS: carrying, lifting, bending, sitting, standing, squatting, and caring for others  PERSONAL FACTORS: Fitness and Time since onset of injury/illness/exacerbation are also affecting patient's functional outcome.   REHAB POTENTIAL: Good  CLINICAL DECISION MAKING: Stable/uncomplicated  EVALUATION COMPLEXITY: Low   GOALS: Goals reviewed with patient? No  SHORT TERM GOALS=LTGs   LONG TERM GOALS: Target date: 06/20/23  Pt will be Ind in a final HEP to maintain achieved LOF  Baseline: started Goal status: INITIAL  2.  Patient will acknowledge a 50%  decrease in pain for improved function and QOL   Baseline: 3-7/10 Goal status: INITIAL  3.  Patient will score at least 16% on Mod Oswestry to signify clinically meaningful improvement in functional abilities.   Baseline: 36% Goal status: INITIAL  4.  Pt will demonstrate a sustained bridge for 60' and a plank from toe for 20" as indication improved core strength Baseline:  Goal status: INITIAL  5.  Pt will voice understanding of measures to assist in pain reduction  Baseline:  Goal status: INITIAL   PLAN:  PT FREQUENCY: 1-2x/week  PT DURATION: 6 weeks  PLANNED INTERVENTIONS: 97164- PT Re-evaluation, 97110-Therapeutic exercises, 97530- Therapeutic activity, [id number]- Neuromuscular re-education, 97535- Self Care, 54098- Manual therapy, Patient/Family education, and Dry Needling.  PLAN FOR NEXT SESSION: HEP review and update, manual techniques as appropriate, aerobic tasks, ROM  and flexibility activities, strengthening and PREs, TPDN, gait and balance training as needed     [employer] MS, PT 05/01/23 4:35 PM

## 2023-04-30 ENCOUNTER — Ambulatory Visit: Payer: 59

## 2023-04-30 ENCOUNTER — Telehealth: Payer: Self-pay

## 2023-04-30 ENCOUNTER — Other Ambulatory Visit: Payer: Self-pay | Admitting: Family Medicine

## 2023-04-30 ENCOUNTER — Other Ambulatory Visit: Payer: Self-pay

## 2023-04-30 DIAGNOSIS — M5459 Other low back pain: Secondary | ICD-10-CM | POA: Diagnosis present

## 2023-04-30 DIAGNOSIS — M6281 Muscle weakness (generalized): Secondary | ICD-10-CM

## 2023-04-30 DIAGNOSIS — R293 Abnormal posture: Secondary | ICD-10-CM | POA: Diagnosis present

## 2023-04-30 LAB — SARS-COV-2 RNA, INFLUENZA A/B, AND RSV RNA, QUALITATIVE NAAT
INFLUENZA A RNA: NOT DETECTED
INFLUENZA B RNA: NOT DETECTED
RSV RNA: NOT DETECTED
SARS COV2 RNA: NOT DETECTED

## 2023-04-30 MED ORDER — NURTEC 75 MG PO TBDP: 1.0000 | ORAL_TABLET | ORAL | 0 refills | Status: DC

## 2023-04-30 NOTE — Telephone Encounter (Signed)
Copied from CRM [id number]. Topic: Clinical - Prescription Issue >> Apr 30, 2023  2:33 PM Carlatta H wrote: Reason for CRM: Patient was supposed to have a prescription written for Headaches//Please send prescription for pharmacy listed and advised patient via mychart

## 2023-05-02 LAB — PAP, TP IMAGING W/ HPV RNA, RFLX HPV TYPE 16,18/45: HPV DNA High Risk: NOT DETECTED

## 2023-05-02 LAB — C. TRACHOMATIS/N. GONORRHOEAE RNA
C. trachomatis RNA, TMA: NOT DETECTED
N. gonorrhoeae RNA, TMA: NOT DETECTED

## 2023-05-02 LAB — PAP, TP IMAGING W/ CT/GC AND W/ HPV RNA, RFLX HPV TYPE 16/18

## 2023-05-03 DIAGNOSIS — Z419 Encounter for procedure for purposes other than remedying health state, unspecified: Secondary | ICD-10-CM | POA: Diagnosis not present

## 2023-05-05 ENCOUNTER — Encounter: Payer: Self-pay | Admitting: Family Medicine

## 2023-05-05 NOTE — Telephone Encounter (Signed)
Called and spoke w/pt reg her PA-Nurtec. Told pt that it shows in CoverMymeds that she has no coverage. Told pt to call her insurance to find out what they will cover. Per pt will call [employer] and let us know.

## 2023-05-05 NOTE — Telephone Encounter (Signed)
I have check w/covermymeds re: PA-nurtec and it shows that pt may not have coverage. Pt is aware and will check with her insurance, however, Pt wanted to know is there something else that she can take instead of Nurtec.   Pls advise?

## 2023-05-06 ENCOUNTER — Telehealth: Payer: Self-pay | Admitting: Family Medicine

## 2023-05-06 NOTE — Telephone Encounter (Signed)
 Copied from CRM [id number]. Topic: Clinical - Prescription Issue >> May 06, 2023  3:10 PM Laurier C wrote: Reason for CRM: Patient states she was advised by her insurance carrier her doctors office needs to provide a prior auth for the following medication: Please call [employer] at [phone]

## 2023-05-07 ENCOUNTER — Other Ambulatory Visit: Payer: Self-pay | Admitting: Family Medicine

## 2023-05-07 ENCOUNTER — Telehealth: Payer: Self-pay

## 2023-05-07 MED ORDER — RIZATRIPTAN BENZOATE 10 MG PO TBDP: 10.0000 mg | ORAL_TABLET | ORAL | 3 refills | Status: DC | PRN

## 2023-05-07 NOTE — Telephone Encounter (Signed)
 Copied from CRM [id number]. Topic: General - Other >> May 06, 2023  3:48 PM Carlatta H wrote: Reason for CRM: Patients insurance provider call to confirm that she does have insurance and to please call back provider if needed// Rimegepant Sulfate (NURTEC) 75 MG TBDP [[phone]]

## 2023-05-09 NOTE — Telephone Encounter (Signed)
 Attempted to call pt's insurance-[employer] twice, line is busy was told to call back.  Will call pt

## 2023-05-13 NOTE — Therapy (Incomplete)
OUTPATIENT PHYSICAL THERAPY THORACOLUMBAR EVALUATION   Patient Name: Annette Whitehead MRN: [id number] DOB:[birth year], 43 y.o., female Today's Date: 05/13/2023  END OF SESSION:    Past Medical History:  Diagnosis Date   Anemia    GERD (gastroesophageal reflux disease)    Headache    Medical history non-contributory    Past Surgical History:  Procedure Laterality Date   NO PAST SURGERIES     Patient Active Problem List   Diagnosis Date Noted   Physical exam, annual 04/28/2023   Cervical cancer screening 04/28/2023   Prediabetes 04/28/2023   Hypertriglyceridemia 04/28/2023   Viral URI with cough 04/28/2023   Chronic bilateral low back pain without sciatica 03/06/2023   Anemia 03/06/2023   History of melena 03/06/2023   Encounter to establish care with new doctor 03/06/2023   Left breast mass 02/27/2016   IUD check up 02/27/2016    PCP: Annette Meo, FNP  REFERRING PROVIDER: Park Meo, FNP  REFERRING DIAG: [phone] (ICD-10-CM) - Chronic bilateral low back pain without sciatica  Rationale for Evaluation and Treatment: Rehabilitation  THERAPY DIAG:  No diagnosis found.  ONSET DATE: chronic 10+ years  SUBJECTIVE:                                                                                                                                                                                           SUBJECTIVE STATEMENT:   EVAL: Pt reports she always have low back pain across her belt line with some days worse than others. The R low back is painful more often than the L.   PERTINENT HISTORY:  Annette Whitehead presents to establish care. Oriented to practice routines and expectations. No recent PCP. PMH includes anemia unknown origin, she does not have heavy menstrual cycles, does not eat much red meat or supplement iron, she does report occasional small amount of blood in her stool as recent as a few weeks ago, this has been happening on  and off for over a year, she did have a hemorrhoid a year ago that has resolved. Denies abdominal pain, nausea, vomiting, constipation, diarrhea. Concerns include bilateral low back pain, has seen chiropractor for this, has also tried heat. Previously had right radiculopathy but not currently. 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 No trauma, injury, or fall. Has not had imaging. Denies saddle numbness, incontinence of stool or urine.  PAIN:  Are you having pain? Yes: NPRS scale: 3/10. Range in the last week prior to PT: 3-7/10. :Pain location: Across the belt line of her low back, R>L Pain description:  Hurt Aggravating factors: Lying down on either side or her back, bending, turning, prolonged standing, lifting Relieving factors: Rest, heating pad, hot shower.pain medication  PRECAUTIONS: None  RED FLAGS: None   WEIGHT BEARING RESTRICTIONS: No  FALLS:  Has patient fallen in last 6 months? 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  DIAGNOSTIC FINDINGS:  LUMBAR SPINE - COMPLETE 4+ VIEW   COMPARISON:  None available   FINDINGS: Mild levoconvex curvature of the thoracolumbar spine. Five lumbar-type vertebral bodies are present. No significant listhesis. Moderate disc height loss and endplate spurring at L5-S1. IUD noted within the central pelvis.   IMPRESSION: Moderate degenerative changes at L5-S1.  PATIENT SURVEYS:  Mod Oswestry 36%  MUSCLE LENGTH: Hamstrings: Right tight deg; Left tight deg Thomas test: Right tight deg; Left tight deg  POSTURE: forward head, increased thoracic kyphosis, and  lateral shift R shoulder on hips  PALPATION: Not TTP  LUMBAR ROM:   AROM eval  Flexion Full; p upon return   Extension Full, R p at end range  Right lateral flexion Full, R p at end range  Left lateral flexion Full, pulling p R  Right rotation Full, min p  Left rotation Full, min p   (Blank rows = not tested)  LOWER EXTREMITY ROM:     WNLs for functional movements Active  Right eval Left eval  Hip flexion    Hip extension    Hip abduction    Hip adduction    Hip internal rotation    Hip external rotation    Knee flexion    Knee extension    Ankle dorsiflexion    Ankle plantarflexion    Ankle inversion    Ankle eversion     (Blank rows = not tested)  LOWER EXTREMITY MMT:    Myotome screen negative. Weak core. MMT Right eval Left eval  Hip flexion    Hip extension    Hip abduction    Hip adduction    Hip internal rotation    Hip external rotation    Knee flexion    Knee extension    Ankle dorsiflexion    Ankle plantarflexion    Ankle inversion    Ankle eversion     (Blank rows = not tested)  LUMBAR SPECIAL TESTS:  Straight leg raise test: Negative and Slump test: Negative  FUNCTIONAL TESTS:  30 seconds chair stand test  GAIT: Distance walked: 30ft x2 Assistive device utilized: None Level of assistance: Complete Independence Comments: unremarkable  TREATMENT DATE:  [facility] Adult PT Treatment:                                                DATE: 05/13/23 Therapeutic Exercise: *** Manual Therapy: *** Neuromuscular re-ed: *** Therapeutic Activity: *** Modalities: *** Self Care: [name] Adult PT Treatment:                                                DATE: 04/30/23 Therapeutic Exercise: Developed, instructed  in, and pt completed therex as noted in HEP                                                                                                                             PATIENT EDUCATION:  Education details: Discussed eval findings, rehab rationale and POC and patient is in agreement  Person educated: Patient Education method:  Explanation Education comprehension: verbalized understanding and needs further education  HOME EXERCISE PROGRAM: Access Code: [email or web address] URL: [email or web address] Date: 04/30/2023 Prepared by: Annette Whitehead  Exercises - Seated Flexion Stretch with Swiss Ball  - 3 x daily - 7 x weekly - 1 sets - 10 reps - 5-10 hold - Supine Lower Trunk Rotation  - 2 x daily - 7 x weekly - 1 sets - 10 reps - 5 hold - Curl Up with Reach  - 2 x daily - 7 x weekly - 2 sets - 10 reps - 3 hold  ASSESSMENT:  CLINICAL IMPRESSION:   EVAL: Patient is a 43 y.o. female who was seen today for physical therapy evaluation and treatment for M54.50,G89.29 (ICD-10-CM) - Chronic bilateral low back pain without sciatica. Pt presents with trunk ext and and R SB reproducing pt's pain and increased lumbar lordosis. Pt is not TTP. X-rays revealed mild levoconvex curvature of the thoracolumbar spine and moderate disc height loss and endplate spurring at L5-S1. An HEP was provided. Pt will benefit from skilled PT 1w6 to address impairments to optimize back function with less pain.   OBJECTIVE IMPAIRMENTS: decreased activity tolerance, decreased strength, postural dysfunction, and pain.   ACTIVITY LIMITATIONS: carrying, lifting, bending, sitting, standing, squatting, and caring for others  PERSONAL FACTORS: Fitness and Time since onset of injury/illness/exacerbation are also affecting patient's functional outcome.   REHAB POTENTIAL: Good  CLINICAL DECISION MAKING: Stable/uncomplicated  EVALUATION COMPLEXITY: Low   GOALS: Goals reviewed with patient? No  SHORT TERM GOALS=LTGs   LONG TERM GOALS: Target date: 06/20/23  Pt will be Ind in a final HEP to maintain achieved LOF  Baseline: started Goal status: INITIAL  2.  Patient will acknowledge a 50% decrease in pain for improved function and QOL   Baseline: 3-7/10 Goal status: INITIAL  3.  Patient will score at least 16% on Mod Oswestry to signify  clinically meaningful improvement in functional abilities.   Baseline: 36% Goal status: INITIAL  4.  Pt will demonstrate a sustained bridge for 60' and a plank from toe for 20" as indication improved core strength Baseline:  Goal status: INITIAL  5.  Pt will voice understanding of measures to assist in pain reduction  Baseline:  Goal status: INITIAL   PLAN:  PT FREQUENCY: 1-2x/week  PT DURATION: 6 weeks  PLANNED INTERVENTIONS: 97164- PT Re-evaluation, 97110-Therapeutic exercises, 97530- Therapeutic activity, [id number]- Neuromuscular re-education, 97535- Self Care, 54098- Manual therapy, Patient/Family education, and Dry Needling.  PLAN FOR NEXT SESSION: HEP review and update, manual techniques as appropriate, aerobic tasks, ROM and flexibility activities,  strengthening and PREs, TPDN, gait and balance training as needed     [employer] MS, PT 05/13/23 10:57 AM

## 2023-05-13 NOTE — Telephone Encounter (Signed)
Spoke w/pt. Per pt will try the Maxalt for now and see if it will help pt's headaches. Per pt disregard PA-Nurtec. Will revisit later if Maxalt does not work.

## 2023-05-14 ENCOUNTER — Ambulatory Visit: Payer: 59

## 2023-05-21 ENCOUNTER — Ambulatory Visit: Payer: 59

## 2023-05-27 ENCOUNTER — Ambulatory Visit
Admission: RE | Admit: 2023-05-27 | Discharge: 2023-05-27 | Disposition: A | Discharge status: Home / Self Care | Payer: 59 | Source: Ambulatory Visit | Attending: Family Medicine | Admitting: Family Medicine

## 2023-05-27 DIAGNOSIS — Z1231 Encounter for screening mammogram for malignant neoplasm of breast: Secondary | ICD-10-CM

## 2023-05-27 NOTE — Therapy (Signed)
 OUTPATIENT PHYSICAL THERAPY THORACOLUMBAR EVALUATION   Patient Name: Annette Whitehead MRN: [id number] DOB:[birth year], 43 y.o., female Today's Date: 05/28/2023  END OF SESSION:  PT End of Session - 05/28/23 1726     Visit Number 2    Number of Visits 7    Date for PT Re-Evaluation 06/20/23    Authorization Type [employer]    PT Start Time 1419    PT Stop Time 1502    PT Time Calculation (min) 43 min    Activity Tolerance Patient tolerated treatment well    Behavior During Therapy [facility] for tasks assessed/performed              Past Medical History:  Diagnosis Date   Anemia    GERD (gastroesophageal reflux disease)    Headache    Medical history non-contributory    Past Surgical History:  Procedure Laterality Date   NO PAST SURGERIES     Patient Active Problem List   Diagnosis Date Noted   Physical exam, annual 04/28/2023   Cervical cancer screening 04/28/2023   Prediabetes 04/28/2023   Hypertriglyceridemia 04/28/2023   Viral URI with cough 04/28/2023   Chronic bilateral low back pain without sciatica 03/06/2023   Anemia 03/06/2023   History of melena 03/06/2023   Encounter to establish care with new doctor 03/06/2023   Left breast mass 02/27/2016   IUD check up 02/27/2016    PCP: Park Meo, FNP  REFERRING PROVIDER: Park Meo, FNP  REFERRING DIAG: [phone] (ICD-10-CM) - Chronic bilateral low back pain without sciatica  Rationale for Evaluation and Treatment: Rehabilitation  THERAPY DIAG:  Other low back pain  Abnormal posture  Muscle weakness (generalized)  ONSET DATE: chronic 10+ years  SUBJECTIVE:                                                                                                                                                                                           SUBJECTIVE STATEMENT: Pt reports she and her family have been sick so she has not been able to attend therapy. Her back pain is the  same. It always hurts, just some days more than other days. Pt states she did the HEP for a few days, but stopped when she became sick.  EVAL: Pt reports she always have low back pain across her belt line with some days worse than others. The R low back is painful more often than the L.   PERTINENT HISTORY:  [name] presents to establish care. Oriented to practice routines and expectations. No recent PCP. PMH includes anemia unknown origin, she does not have heavy  menstrual cycles, does not eat much red meat or supplement iron, she does report occasional small amount of blood in her stool as recent as a few weeks ago, this has been happening on and off for over a year, she did have a hemorrhoid a year ago that has resolved. Denies abdominal pain, nausea, vomiting, constipation, diarrhea. Concerns include bilateral low back pain, has seen chiropractor for this, has also tried heat. Previously had right radiculopathy but not currently. 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 No trauma, injury, or fall. Has not had imaging. Denies saddle numbness, incontinence of stool or urine.  PAIN:  Are you having pain? Yes: NPRS scale: 4/10. Range in the last week prior to PT: 3-7/10. :Pain location: Across the belt line of her low back, R>L Pain description: Hurt Aggravating factors: Lying down on either side or her back, bending, turning, prolonged standing, lifting Relieving factors: Rest, heating pad, hot shower.pain medication  PRECAUTIONS: None  RED FLAGS: None   WEIGHT BEARING RESTRICTIONS: No  FALLS:  Has patient fallen in last 6 months? 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  DIAGNOSTIC FINDINGS:  LUMBAR SPINE -  COMPLETE 4+ VIEW   COMPARISON:  None available   FINDINGS: Mild levoconvex curvature of the thoracolumbar spine. Five lumbar-type vertebral bodies are present. No significant listhesis. Moderate disc height loss and endplate spurring at L5-S1. IUD noted within the central pelvis.   IMPRESSION: Moderate degenerative changes at L5-S1.  PATIENT SURVEYS:  Mod Oswestry 36%  MUSCLE LENGTH: Hamstrings: Right tight deg; Left tight deg Thomas test: Right tight deg; Left tight deg  POSTURE: forward head, increased thoracic kyphosis, and  lateral shift R shoulder on hips  PALPATION: Not TTP  LUMBAR ROM:   AROM eval  Flexion Full; p upon return  Extension Full, R p at end range  Right lateral flexion Full, R p at end range  Left lateral flexion Full, pulling p R  Right rotation Full, min p  Left rotation Full, min p   (Blank rows = not tested)  LOWER EXTREMITY ROM:     WNLs for functional movements Active  Right eval Left eval  Hip flexion    Hip extension    Hip abduction    Hip adduction    Hip internal rotation    Hip external rotation    Knee flexion    Knee extension    Ankle dorsiflexion    Ankle plantarflexion    Ankle inversion    Ankle eversion     (Blank rows = not tested)  LOWER EXTREMITY MMT:    Myotome screen negative. Weak core. MMT Right eval Left eval  Hip flexion    Hip extension    Hip abduction    Hip adduction    Hip internal rotation    Hip external rotation    Knee flexion    Knee extension    Ankle dorsiflexion    Ankle plantarflexion    Ankle inversion    Ankle eversion     (Blank rows = not tested)  LUMBAR SPECIAL TESTS:  Straight leg raise test: Negative and Slump test: Negative  FUNCTIONAL TESTS:  30 seconds chair stand test  GAIT:  Distance walked: 43ft x2 Assistive device utilized: None Level of assistance: Complete Independence Comments: unremarkable  TREATMENT DATE:  [facility] Adult PT Treatment:                                                 DATE: 05/28/23 Therapeutic Exercise: Seated Flexion Stretch with Swiss Ball forward and laterally SKTC 2x 30" DKTC 2x 30' Supine Lower Trunk Rotation 5x 5" Curl Up with Reach 15x 3" Bridging 15x 3 Alt supine hip clams x15 GTB Self Care: Tennis ball/wall massage with pt returning demonstration  [facility] Adult PT Treatment:                                                DATE: 04/30/23 Therapeutic Exercise: Developed, instructed in, and pt completed therex as noted in HEP                                                                                                                             PATIENT EDUCATION:  Education details: Discussed eval findings, rehab rationale and POC and patient is in agreement  Person educated: Patient Education method: Explanation Education comprehension: verbalized understanding and needs further education  HOME EXERCISE PROGRAM: Access Code: [email or web address] URL: [email or web address] Date: 05/28/2023 Prepared by: Joellyn Rued  Exercises - Seated Flexion Stretch with Swiss Ball  - 1 x daily - 7 x weekly - 1 sets - 10 reps - 5-10 hold - Hooklying Single Knee to Chest  - 1 x daily - 7 x weekly - 1 sets - 10 reps - Supine Double Knee to Chest  - 1 x daily - 7 x weekly - 1 sets - 10 reps - Supine Lower Trunk Rotation  - 1 x daily - 7 x weekly - 1 sets - 10 reps - 5 hold - Curl Up with Reach  - 1 x daily - 7 x weekly - 2 sets - 10 reps - 3 hold - Supine Bridge  - 1 x daily - 7 x weekly - 2 sets - 10 reps - 3 hold - Hooklying Isometric Clamshell  - 1 x daily - 7 x weekly - 2 sets - 10 reps - 3 hold  ASSESSMENT:  CLINICAL IMPRESSION: Pt returns to PT after 4 weeks since the initial eval to to her and her family being sick. Pt reports her back pain is the same and her completion of her HEP has been very limited. PT was completed today for therex for lumbopelvic flexibility and strengthening. Pt reports a lot of tightness in her R  low back. Pt was also instructed in the use of a tennis ball for self low back massage. Pt found the  tennis ball massage to be relieving. Pt was encouraged to complete her HEP. Pt tolerated PT today without adverse effects.  EVAL: Patient is a 43 y.o. female who was seen today for physical therapy evaluation and treatment for M54.50,G89.29 (ICD-10-CM) - Chronic bilateral low back pain without sciatica. Pt presents with trunk ext and and R SB reproducing pt's pain and increased lumbar lordosis. Pt is not TTP. X-rays revealed mild levoconvex curvature of the thoracolumbar spine and moderate disc height loss and endplate spurring at L5-S1. An HEP was provided. Pt will benefit from skilled PT 1w6 to address impairments to optimize back function with less pain.   OBJECTIVE IMPAIRMENTS: decreased activity tolerance, decreased strength, postural dysfunction, and pain.   ACTIVITY LIMITATIONS: carrying, lifting, bending, sitting, standing, squatting, and caring for others  PERSONAL FACTORS: Fitness and Time since onset of injury/illness/exacerbation are also affecting patient's functional outcome.   REHAB POTENTIAL: Good  CLINICAL DECISION MAKING: Stable/uncomplicated  EVALUATION COMPLEXITY: Low   GOALS: Goals reviewed with patient? No  SHORT TERM GOALS=LTGs   LONG TERM GOALS: Target date: 06/20/23  Pt will be Ind in a final HEP to maintain achieved LOF  Baseline: started Goal status: INITIAL  2.  Patient will acknowledge a 50% decrease in pain for improved function and QOL   Baseline: 3-7/10 Goal status: INITIAL  3.  Patient will score at least 16% on Mod Oswestry to signify clinically meaningful improvement in functional abilities.   Baseline: 36% Goal status: INITIAL  4.  Pt will demonstrate a sustained bridge for 60' and a plank from toe for 20" as indication improved core strength Baseline:  Goal status: INITIAL  5.  Pt will voice understanding of measures to assist in pain  reduction  Baseline:  Goal status: INITIAL   PLAN:  PT FREQUENCY: 1-2x/week  PT DURATION: 6 weeks  PLANNED INTERVENTIONS: 97164- PT Re-evaluation, 97110-Therapeutic exercises, 97530- Therapeutic activity, [id number]- Neuromuscular re-education, 97535- Self Care, 16109- Manual therapy, Patient/Family education, and Dry Needling.  PLAN FOR NEXT SESSION: HEP review and update, manual techniques as appropriate, aerobic tasks, ROM and flexibility activities, strengthening and PREs, TPDN, gait and balance training as needed     [employer] MS, PT 05/28/23 5:39 PM

## 2023-05-28 ENCOUNTER — Ambulatory Visit: Payer: 59

## 2023-05-28 DIAGNOSIS — M6281 Muscle weakness (generalized): Secondary | ICD-10-CM | POA: Insufficient documentation

## 2023-05-28 DIAGNOSIS — M5459 Other low back pain: Secondary | ICD-10-CM | POA: Diagnosis present

## 2023-05-28 DIAGNOSIS — R293 Abnormal posture: Secondary | ICD-10-CM | POA: Insufficient documentation

## 2023-05-31 DIAGNOSIS — Z419 Encounter for procedure for purposes other than remedying health state, unspecified: Secondary | ICD-10-CM | POA: Diagnosis not present

## 2023-06-04 ENCOUNTER — Ambulatory Visit: Payer: 59

## 2023-06-04 DIAGNOSIS — M6281 Muscle weakness (generalized): Secondary | ICD-10-CM | POA: Diagnosis present

## 2023-06-04 DIAGNOSIS — R293 Abnormal posture: Secondary | ICD-10-CM | POA: Insufficient documentation

## 2023-06-04 DIAGNOSIS — M5459 Other low back pain: Secondary | ICD-10-CM | POA: Diagnosis present

## 2023-06-10 ENCOUNTER — Ambulatory Visit

## 2023-06-10 ENCOUNTER — Telehealth: Payer: Self-pay

## 2023-06-10 NOTE — Telephone Encounter (Signed)
 Spoke to pt by phone re: a no show visit. Pt states she thought her appt was tomorrow. Pt does not future scheduled appts and was advised to call in to schedule to continue PT.

## 2023-06-16 ENCOUNTER — Institutional Professional Consult (permissible substitution): Payer: 59 | Admitting: Obstetrics and Gynecology

## 2023-06-16 ENCOUNTER — Ambulatory Visit: Payer: 59 | Admitting: Obstetrics and Gynecology

## 2023-06-16 ENCOUNTER — Encounter: Payer: Self-pay | Admitting: Obstetrics and Gynecology

## 2023-06-16 VITALS — BP 112/69 | HR 71 | Ht 61.0 in | Wt 145.0 lb

## 2023-06-16 DIAGNOSIS — Z3009 Encounter for other general counseling and advice on contraception: Secondary | ICD-10-CM | POA: Diagnosis not present

## 2023-06-16 NOTE — Progress Notes (Signed)
   NEW GYNECOLOGY VISIT  Subjective:  Annette Whitehead is a 43 y.o. [phone] with Paragard IUD in place (11/22/15) presenting for sterilization consult.   Is done having children and had a friend who recently got a tubal and was really happy with her results. She is interested in getting a tubal so she can be done having children. She has a paragard in place that is working well for her. She gets a regular period that doesn't bother her and she doesn't have side effects.   No prior surgeries. Takes rizatriptan for migraines.   Objective:   Vitals:   06/16/23 1424  BP: 112/69  Pulse: 71  Weight: 145 lb (65.8 kg)  Height: 5\' 1"  (1.549 m)   General:  Alert, oriented and cooperative. Patient is in no acute distress.  Skin: Skin is warm and dry. No rash noted.   Cardiovascular: Normal heart rate noted  Respiratory: Normal respiratory effort, no problems with respiration noted  Abdomen: Soft, non-tender, non-distended    Assessment and Plan:  Annette Whitehead is a 43 y.o. with undesired fertility  1. Consultation for female sterilization (Primary) - Discussed laparoscopic bilateral salpingectomy including risks/benefits, post op restrictions and expectations - Discussed comparable options are LARC and vasectomy that have the benefit of lower surgical risk and reversibility (LARC) - Pt is happy with her Paragard. It does not need to be removed/replaced until 10/2027.  - After our discussion, she would rather keep paragard in and remove/replace when it expires.   Follow up for annual or as needed  Lennart Pall, MD

## 2023-06-30 ENCOUNTER — Ambulatory Visit: Payer: Self-pay

## 2023-06-30 NOTE — Telephone Encounter (Signed)
 Copied from CRM [id number]. Topic: Clinical - Medication Question >> Jun 30, 2023  1:31 PM Ja-Kwan M wrote: Reason for CRM: Patient would like to request an increase in the dosage of the migraine medication. Patient requests call back to discuss. Call back# [phone]  Chief Complaint: Migraine Symptoms: Headache, nausea Frequency: Today, chronic Pertinent Negatives: Patient denies relief Disposition: [] ED /[] Urgent Care (no appt availability in office) / [x] Appointment(In office/virtual)/ []  [facility]/ [] Home Care/ [] Refused Recommended Disposition /[] [facility]/ []  Follow-up with PCP Additional Notes: Patient called in requesting a different medication to be prescribed for her migraines. Patient is currently experiencing a migraine. Patient stated she had a very severe migraine 1-2 weeks ago. Patient stated she has not vomited yet, but will later today. Patient stated rizatriptan is no longer providing relief. This RN advised patient to see a provider within 3 days, per protocol. Patient scheduled with PCP tomorrow. This RN advised patient to call back if symptoms worsen. Patient complied.   Reason for Disposition  Prescription request for new medicine (not a refill)  Answer Assessment - Initial Assessment Questions 1. LOCATION: "Where does it hurt?"      Starting from forehead 2. ONSET: "When did the headache start?" (Minutes, hours or days)      1-2 hours ago 3. PATTERN: "Does the pain come and go, or has it been constant since it started?"     States pain is constant  4. SEVERITY: "How bad is the pain?" and "What does it keep you from doing?"  (e.g., Scale 1-10; mild, moderate, or severe)   - MILD (1-3): doesn't interfere with normal activities    - MODERATE (4-7): interferes with normal activities or awakens from sleep    - SEVERE (8-10): excruciating pain, unable to do any normal activities        Rates pain 4-5 at this time 5. RECURRENT SYMPTOM: "Have you  ever had headaches before?" If Yes, ask: "When was the last time?" and "What happened that time?"      1-2 weeks ago 6. CAUSE: "What do you think is causing the headache?"     Migraine 7. MIGRAINE: "Have you been diagnosed with migraine headaches?" If Yes, ask: "Is this headache similar?"      Yes 8. HEAD INJURY: "Has there been any recent injury to the head?"      N/A 9. OTHER SYMPTOMS: "Do you have any other symptoms?" (fever, stiff neck, eye pain, sore throat, cold symptoms)     Denies vomiting, states she will vomit later  Protocols used: Headache-A-AH, Medication Question Call-A-AH

## 2023-07-01 ENCOUNTER — Ambulatory Visit (INDEPENDENT_AMBULATORY_CARE_PROVIDER_SITE_OTHER): Admitting: Family Medicine

## 2023-07-01 ENCOUNTER — Encounter: Payer: Self-pay | Admitting: Family Medicine

## 2023-07-01 VITALS — BP 118/68 | HR 78 | Temp 98.6°F | Ht 61.0 in | Wt 144.4 lb

## 2023-07-01 DIAGNOSIS — G43109 Migraine with aura, not intractable, without status migrainosus: Secondary | ICD-10-CM | POA: Insufficient documentation

## 2023-07-01 MED ORDER — NURTEC 75 MG PO TBDP: 1.0000 | ORAL_TABLET | ORAL | 1 refills | Status: DC

## 2023-07-01 NOTE — Assessment & Plan Note (Addendum)
 No red flags on exam, negative [id number]. Failed treatment with Maxalt. Start Nurtec 75mg  every other day PRN for migraines. If ineffective or if increase in frequency or quality of headaches return to office. Given these are worse this time of year recommended also trying Zyrtec daily and see if this helps. Nurtec sample provided

## 2023-07-01 NOTE — Progress Notes (Signed)
 Subjective:  HPI: Annette Whitehead is a 43 y.o. female presenting on 07/01/2023 for Migraine (Pt wants a higher dose of migraine medication. )   Migraine    Patient is in today for migraines that are not controlled with her current dose of Rizatriptan. This has provided her with minimal relief since prescribed. Described as frontal pressure that progress toward the back. These last for around 9 hours. She also has nausea and vomiting as well as photosensitivity. She has been evaluated in ED for this in January, CT head was normal at that time. These headaches are worse in the summer. She denies abrupt onset, positional, association with sneezing or coughing, is not pregnant, vision changes.   Review of Systems  All other systems reviewed and are negative.   Relevant past medical history reviewed and updated as indicated.   Past Medical History:  Diagnosis Date   Anemia    GERD (gastroesophageal reflux disease)    Headache    Medical history non-contributory    Prediabetes      Past Surgical History:  Procedure Laterality Date   NO PAST SURGERIES      Allergies and medications reviewed and updated.   Current Outpatient Medications:    Rimegepant Sulfate (NURTEC) 75 MG TBDP, Take 1 tablet (75 mg total) by mouth every other day., Disp: 15 tablet, Rfl: 1  No Known Allergies  Objective:   BP 118/68   Pulse 78   Temp 98.6 F (37 C)   Ht 5\' 1"  (1.549 m)   Wt 144 lb 6 oz (65.5 kg)   LMP 06/02/2023 (Approximate)   SpO2 99%   BMI 27.28 kg/m      07/01/2023    2:19 PM 06/16/2023    2:24 PM 04/28/2023    2:06 PM  Vitals with BMI  Height 5\' 1"  5\' 1"  5\' 1"   Weight 144 lbs 6 oz 145 lbs 144 lbs  BMI 27.29 27.41 27.22  Systolic 118 112 409  Diastolic 68 69 70  Pulse 78 71 84     Physical Exam Vitals and nursing note reviewed.  Constitutional:      Appearance: Normal appearance. She is normal weight.  HENT:     Head: Normocephalic and atraumatic.  Skin:     General: Skin is warm and dry.  Neurological:     General: No focal deficit present.     Mental Status: She is alert and oriented to person, place, and time. Mental status is at baseline.  Psychiatric:        Mood and Affect: Mood normal.        Behavior: Behavior normal.        Thought Content: Thought content normal.        Judgment: Judgment normal.     Assessment & Plan:  Migraine with aura and without status migrainosus, not intractable Assessment & Plan: No red flags on exam, negative [id number]. Failed treatment with Maxalt. Start Nurtec 75mg  every other day PRN for migraines. If ineffective or if increase in frequency or quality of headaches return to office. Given these are worse this time of year recommended also trying Zyrtec daily and see if this helps. Nurtec sample provided   Other orders -     Nurtec; Take 1 tablet (75 mg total) by mouth every other day.  Dispense: 15 tablet; Refill: 1     Follow up plan: Return if symptoms worsen or fail to improve.  Park Meo, FNP

## 2023-07-12 DIAGNOSIS — Z419 Encounter for procedure for purposes other than remedying health state, unspecified: Secondary | ICD-10-CM | POA: Diagnosis not present

## 2023-07-23 ENCOUNTER — Telehealth: Payer: Self-pay

## 2023-07-23 NOTE — Telephone Encounter (Signed)
 Copied from CRM [id number]. Topic: Clinical - Prescription Issue >> Jul 23, 2023  3:04 PM Annette Whitehead wrote: Reason for CRM: Rimegepant Sulfate (NURTEC) 75 MG TBDP, patient states this is not being refilled and she needs help. She said she does not know what the issue is.  She is wanting to know if she can get some sample packs from the office. Please call to advise.

## 2023-08-06 DIAGNOSIS — H5213 Myopia, bilateral: Secondary | ICD-10-CM | POA: Diagnosis not present

## 2023-08-07 ENCOUNTER — Other Ambulatory Visit: Payer: Self-pay | Admitting: Family Medicine

## 2023-08-07 ENCOUNTER — Encounter: Payer: Self-pay | Admitting: Family Medicine

## 2023-08-07 DIAGNOSIS — G43109 Migraine with aura, not intractable, without status migrainosus: Secondary | ICD-10-CM

## 2023-08-11 DIAGNOSIS — Z419 Encounter for procedure for purposes other than remedying health state, unspecified: Secondary | ICD-10-CM | POA: Diagnosis not present

## 2023-09-04 ENCOUNTER — Telehealth: Payer: Self-pay

## 2023-09-04 ENCOUNTER — Other Ambulatory Visit: Payer: Self-pay | Admitting: Family Medicine

## 2023-09-04 MED ORDER — NURTEC 75 MG PO TBDP: 1.0000 | ORAL_TABLET | ORAL | 1 refills | Status: DC

## 2023-09-04 NOTE — Telephone Encounter (Signed)
 Encounter made in error. Please see previous encounter .

## 2023-09-04 NOTE — Telephone Encounter (Signed)
 I have contacted the pharmacy. They do need a PA I have submitted one to the PA Rx Team for completion. Awaiting update.

## 2023-09-05 ENCOUNTER — Other Ambulatory Visit (HOSPITAL_COMMUNITY): Payer: Self-pay

## 2023-09-09 ENCOUNTER — Other Ambulatory Visit (HOSPITAL_COMMUNITY): Payer: Self-pay

## 2023-09-09 NOTE — Telephone Encounter (Signed)
 Yes the preferred list were injectables

## 2023-09-09 NOTE — Telephone Encounter (Signed)
 Good afternoon has the pt tried and failed the preferred meds yet? According to the chart note from April she has to try 1 of the other alternatives 1st.

## 2023-09-11 DIAGNOSIS — Z419 Encounter for procedure for purposes other than remedying health state, unspecified: Secondary | ICD-10-CM | POA: Diagnosis not present

## 2023-09-26 ENCOUNTER — Other Ambulatory Visit (HOSPITAL_COMMUNITY): Payer: Self-pay

## 2023-10-11 DIAGNOSIS — Z419 Encounter for procedure for purposes other than remedying health state, unspecified: Secondary | ICD-10-CM | POA: Diagnosis not present

## 2023-10-24 ENCOUNTER — Ambulatory Visit: Admitting: Neurology

## 2023-11-05 ENCOUNTER — Telehealth: Payer: Self-pay | Admitting: Pharmacist

## 2023-11-05 ENCOUNTER — Encounter: Payer: Self-pay | Admitting: Neurology

## 2023-11-05 ENCOUNTER — Ambulatory Visit (INDEPENDENT_AMBULATORY_CARE_PROVIDER_SITE_OTHER): Admitting: Neurology

## 2023-11-05 VITALS — BP 127/88 | HR 74 | Ht 61.0 in | Wt 143.0 lb

## 2023-11-05 DIAGNOSIS — G43109 Migraine with aura, not intractable, without status migrainosus: Secondary | ICD-10-CM

## 2023-11-05 DIAGNOSIS — H52 Hypermetropia: Secondary | ICD-10-CM | POA: Diagnosis not present

## 2023-11-05 MED ORDER — NURTEC 75 MG PO TBDP: 75.0000 mg | ORAL_TABLET | Freq: Every day | ORAL | 11 refills | Status: AC | PRN

## 2023-11-05 MED ORDER — ONDANSETRON 8 MG PO TBDP
8.0000 mg | ORAL_TABLET | Freq: Three times a day (TID) | ORAL | 11 refills | Status: AC | PRN
Start: 2023-11-05 — End: ?

## 2023-11-05 NOTE — Telephone Encounter (Signed)
 Pharmacy Patient Advocate Encounter  Received notification from [facility] that Prior Authorization for Nurtec 75MG  dispersible tablets has been APPROVED from 11/05/2023 to 11/04/2024   PA #/Case ID/Reference #: [id number]

## 2023-11-05 NOTE — Telephone Encounter (Signed)
 Pharmacy Patient Advocate Encounter   Received notification from Patient Pharmacy that prior authorization for Nurtec 75MG  dispersible tablets is required/requested.   Insurance verification completed.   The patient is insured through [facility] .   Per test claim: PA required; PA submitted to above mentioned insurance via CoverMyMeds Key/confirmation #/[id number] Status is pending

## 2023-11-05 NOTE — Progress Notes (Signed)
 [id number]    Provider:  Dr Ines Requesting Provider: Kayla Jeoffrey RAMAN, FNP Primary Care Provider:  Kayla Jeoffrey RAMAN, FNP  CC:  migraines  HPI:  Annette Whitehead is a 43 y.o. female here as requested by Kayla Jeoffrey RAMAN, FNP for migraines. has Left breast mass; IUD check up; Chronic bilateral low back pain without sciatica; Anemia; History of melena; Encounter to establish care with new doctor; Physical exam, annual; Cervical cancer screening; Prediabetes; Hypertriglyceridemia; Viral URI with cough; and Migraine with aura and without status migrainosus, not intractable on their problem list.  She has had the migraines for years, more than 10 years, heat still a trigger, starts in the frontal area or can be unilateral and spread all over, pulsating/pounding/throbbing, light and sound sensitivity moreso heat, nausea and vomiting, can bemoderate to severe, she feel hot then cold, no aura, no medication overuse, not exertional or positional, ice on her head helps, laying down and closing eyes and turning off lights helps. Taking the nurtec as needed. Imitrex and rizatriptan  did not help. She reports 6 migraine days a month and < 8 total headache days a month. No medication overuse. Nurtec helps tremendously.   Reviewed notes, labs and imaging from outside physicians, which showed:  From a thorough review of records and patient report, Medications tried that can be used in migraine/headache management greater than 3 months include: Lifestyle modification, headache diaries, better sleep hygiene, exercise, management of migraine triggers, OTC and prescribed analgesics/nsaids such as ibuprofen , excedrin, alleve and others, flexeril , reglan , zofran , nurtec, imitrex, rizatriptan , ubrelvy  Labs: 04/14/2023: Hgba1c 5.8 abnormal, rpr non reactive, tsh nml 1.45  CT head 04/17/2023: CLINICAL DATA:  Headache, nausea   EXAM: CT HEAD WITHOUT CONTRAST: reviewed imaging and agree    TECHNIQUE: Contiguous axial images were obtained from the base of the skull through the vertex without intravenous contrast.   RADIATION DOSE REDUCTION: This exam was performed according to the departmental dose-optimization program which includes automated exposure control, adjustment of the mA and/or kV according to patient size and/or use of iterative reconstruction technique.   COMPARISON:  None Available.   FINDINGS: Brain: No evidence of acute infarction, hemorrhage, hydrocephalus, extra-axial collection or mass lesion/mass effect.   Vascular: No hyperdense vessel or unexpected calcification.   Skull: Normal. Negative for fracture or focal lesion.   Sinuses/Orbits: The visualized paranasal sinuses are essentially clear. The mastoid air cells are unopacified.   Other: None.   IMPRESSION: Normal head CT.    Review of Systems: Patient complains of symptoms per HPI as well as the following symptoms none. Pertinent negatives and positives per HPI. All others negative.   Social History   Socioeconomic History   Marital status: Married    Spouse name: Not on file   Number of children: Not on file   Years of education: Not on file   Highest education level: 12th grade  Occupational History   Not on file  Tobacco Use   Smoking status: Never   Smokeless tobacco: Never  Vaping Use   Vaping status: Never Used  Substance and Sexual Activity   Alcohol use: No   Drug use: No   Sexual activity: Yes    Birth control/protection: None, I.U.D.  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,    Social Drivers of Corporate investment banker Strain: [employer]  Risk (03/05/2023)   Overall Financial Resource Strain (CARDIA)    Difficulty of Paying Living Expenses: Somewhat hard  Food Insecurity: Food Insecurity Present (03/05/2023)   Hunger Vital Sign    Worried About  Running Out of Food in the Last Year: Sometimes true    Ran Out of Food in the Last Year: Often true  Transportation Needs: No Transportation Needs (03/05/2023)   PRAPARE - Administrator, Civil Service (Medical): No    Lack of Transportation (Non-Medical): No  Physical Activity: Unknown (03/05/2023)   Exercise Vital Sign    Days of Exercise per Week: 0 days    Minutes of Exercise per Session: Not on file  Stress: No Stress Concern Present (03/05/2023)   [employer] of Occupational Health - Occupational Stress Questionnaire    Feeling of Stress : Not at all  Social Connections: Moderately Integrated (03/05/2023)   Social Connection and Isolation Panel    Frequency of Communication with Friends and Family: More than three times a week    Frequency of Social Gatherings with Friends and Family: More than three times a week    Attends Religious Services: More than 4 times per year    Active Member of [employer] or Organizations: No    Attends Engineer, structural: Not on file    Marital Status: Married  Catering manager Violence: Not on file    Family History  Problem Relation Age of Onset   Diabetes Mother    Hypertension Mother    Hyperlipidemia Mother    Osteoporosis Mother    Migraines Mother    Diabetes Father    Migraines Sister    Migraines Sister    Migraines Sister    BRCA 1/2 Neg Hx     Past Medical History:  Diagnosis Date   Anemia    GERD (gastroesophageal reflux disease)    Headache    Medical history non-contributory    Prediabetes     Patient Active Problem List   Diagnosis Date Noted   Migraine with aura and without status migrainosus, not intractable 07/01/2023   Physical exam, annual 04/28/2023   Cervical cancer screening 04/28/2023   Prediabetes 04/28/2023   Hypertriglyceridemia 04/28/2023   Viral URI with cough 04/28/2023   Chronic bilateral low back pain without sciatica 03/06/2023   Anemia 03/06/2023   History of melena  03/06/2023   Encounter to establish care with new doctor 03/06/2023   Left breast mass 02/27/2016   IUD check up 02/27/2016    Past Surgical History:  Procedure Laterality Date   NO PAST SURGERIES      Current Outpatient Medications  Medication Sig Dispense Refill   ondansetron  (ZOFRAN -ODT) 8 MG disintegrating tablet Take 1 tablet (8 mg total) by mouth every 8 (eight) hours as needed. 20 tablet 11   Rimegepant Sulfate (NURTEC) 75 MG TBDP Take 1 tablet (75 mg total) by mouth daily as needed. For migraines. Take as close to onset of migraine as possible. One daily maximum. Please run copay card: BIN [id number] PCN CN GRP [id number] iD [id number] EXP 03/31/2024 16 tablet 11   No current facility-administered medications for this visit.    Allergies as of 11/05/2023   (No Known Allergies)    Vitals: BP 127/88 (Cuff Size: Normal)   Pulse 74   Ht 5' 1 (1.549 m)   Wt 143 lb (64.9 kg)   BMI 27.02 kg/m  Last Weight:  Wt Readings from Last 1 Encounters:  11/05/23 143 lb (  64.9 kg)   Last Height:   Ht Readings from Last 1 Encounters:  11/05/23 5' 1 (1.549 m)     Physical exam: Exam: Gen: NAD, conversant, well nourised, well groomed                     CV: RRR, no MRG. No Carotid Bruits. No peripheral edema, warm, nontender Eyes: Conjunctivae clear without exudates or hemorrhage  Neuro: Detailed Neurologic Exam  Speech:    Speech is normal; fluent and spontaneous with normal comprehension.  Cognition:    The patient is oriented to person, place, and time;     recent and remote memory intact;     language fluent;     normal attention, concentration,     fund of knowledge Cranial Nerves:    The pupils are equal, round, and reactive to light. The fundi are normal and spontaneous venous pulsations are present. Visual fields are full to finger confrontation. Extraocular movements are intact. Trigeminal sensation is intact and the muscles of mastication are normal. The face is  symmetric. The palate elevates in the midline. Hearing intact. Voice is normal. Shoulder shrug is normal. The tongue has normal motion without fasciculations.   Coordination: normal  Gait: normal  Motor Observation:    No asymmetry, no atrophy, and no involuntary movements noted. Tone:    Normal muscle tone.    Posture:    Posture is normal. normal erect    Strength:    Strength is V/V in the upper and lower limbs.      Sensation: intact to LT     Reflex Exam:  DTR's:    Deep tendon reflexes in the upper and lower extremities are normal bilaterally.   Toes:    The toes are downgoing bilaterally.   Clonus:    Clonus is absent.    Assessment/Plan:  Episodic migraines, normal neuro exam. She reports 6 migraine days a month and < 8 total headache days a month. No medication overuse. Nurtec helps tremendously.    Meds ordered this encounter  Medications   Rimegepant Sulfate (NURTEC) 75 MG TBDP    Sig: Take 1 tablet (75 mg total) by mouth daily as needed. For migraines. Take as close to onset of migraine as possible. One daily maximum. Please run copay card: BIN [id number] PCN CN GRP [id number] iD [id number] EXP 03/31/2024    Dispense:  16 tablet    Refill:  11    Please run copay card: BIN [id number] PCN CN GRP [id number] iD [id number] EXP 03/31/2024   ondansetron  (ZOFRAN -ODT) 8 MG disintegrating tablet    Sig: Take 1 tablet (8 mg total) by mouth every 8 (eight) hours as needed.    Dispense:  20 tablet    Refill:  11   Discussed:  There is increased risk for stroke in women with migraine with aura and a contraindication for the combined contraceptive pill for use by women who have migraine with aura. The risk for women with migraine without aura is lower. However other risk factors like smoking are far more likely to increase stroke risk than migraine. There is a recommendation for no smoking and for the use of OCPs without estrogen such as progestogen only pills particularly for  women with migraine with aura.SABRA People who have migraine headaches with auras may be 3 times more likely to have a stroke caused by a blood clot, compared to migraine patients who don't see auras. Women who take hormone-replacement therapy may  be 30 percent more likely to suffer a clot-based stroke than women not taking medication containing estrogen. Other risk factors like smoking and high blood pressure may be  much more important. And stroke is still a rare complication due to migraine aura and is controversial and lower doses may not cause a risk.    Cc: Kayla Jeoffrey RAMAN, FNP,  Kayla Jeoffrey RAMAN, FNP  Onetha Epp, MD  [facility] [address] [place], [place] [address]  Phone [phone] Fax [phone]

## 2023-11-09 ENCOUNTER — Encounter: Payer: Self-pay | Admitting: Neurology

## 2023-11-11 DIAGNOSIS — Z419 Encounter for procedure for purposes other than remedying health state, unspecified: Secondary | ICD-10-CM | POA: Diagnosis not present

## 2023-12-12 DIAGNOSIS — Z419 Encounter for procedure for purposes other than remedying health state, unspecified: Secondary | ICD-10-CM | POA: Diagnosis not present

## 2024-02-27 ENCOUNTER — Encounter: Payer: Self-pay | Admitting: Family Medicine

## 2024-03-12 DIAGNOSIS — Z419 Encounter for procedure for purposes other than remedying health state, unspecified: Secondary | ICD-10-CM | POA: Diagnosis not present

## 2024-11-04 ENCOUNTER — Telehealth: Admitting: Adult Health
